# Patient Record
Sex: Male | Born: 1980 | Race: Black or African American | Hispanic: No | Marital: Single | Smoking: Never smoker
Health system: Southern US, Community
[De-identification: ages and names within clinical notes are randomized; demographics above are authoritative.]

## PROBLEM LIST (undated history)

## (undated) DIAGNOSIS — Z973 Presence of spectacles and contact lenses: Secondary | ICD-10-CM

## (undated) DIAGNOSIS — S83206A Unspecified tear of unspecified meniscus, current injury, right knee, initial encounter: Secondary | ICD-10-CM

## (undated) DIAGNOSIS — M1711 Unilateral primary osteoarthritis, right knee: Secondary | ICD-10-CM

## (undated) DIAGNOSIS — M1990 Unspecified osteoarthritis, unspecified site: Secondary | ICD-10-CM

## (undated) DIAGNOSIS — E7800 Pure hypercholesterolemia, unspecified: Secondary | ICD-10-CM

## (undated) DIAGNOSIS — F1010 Alcohol abuse, uncomplicated: Secondary | ICD-10-CM

## (undated) HISTORY — DX: Alcohol abuse, uncomplicated: F10.10

## (undated) HISTORY — DX: Pure hypercholesterolemia, unspecified: E78.00

## (undated) HISTORY — DX: Unspecified osteoarthritis, unspecified site: M19.90

## (undated) HISTORY — DX: Unilateral primary osteoarthritis, right knee: M17.11

---

## 2007-08-10 HISTORY — PX: KNEE ARTHROSCOPY WITH MENISCAL REPAIR: SHX5653

## 2012-01-13 ENCOUNTER — Encounter (HOSPITAL_COMMUNITY): Payer: Self-pay | Admitting: Emergency Medicine

## 2012-01-13 ENCOUNTER — Emergency Department (HOSPITAL_COMMUNITY)
Admission: EM | Admit: 2012-01-13 | Discharge: 2012-01-13 | Disposition: A | Discharge status: Home / Self Care | Payer: 59 | Source: Home / Self Care | Attending: Family Medicine | Admitting: Family Medicine

## 2012-01-13 DIAGNOSIS — R300 Dysuria: Secondary | ICD-10-CM

## 2012-01-13 LAB — POCT URINALYSIS DIP (DEVICE)
Bilirubin Urine: NEGATIVE
Glucose, UA: NEGATIVE mg/dL
Hgb urine dipstick: NEGATIVE
Ketones, ur: NEGATIVE mg/dL
Nitrite: NEGATIVE
Specific Gravity, Urine: <=1.005 (ref 1.005–1.030)
pH: 6 (ref 5.0–8.0)

## 2012-01-13 MED ORDER — PHENAZOPYRIDINE HCL 200 MG PO TABS: 200.0000 mg | ORAL_TABLET | Freq: Three times a day (TID) | ORAL | Status: AC

## 2012-01-13 NOTE — ED Notes (Signed)
No pcp

## 2012-01-13 NOTE — ED Notes (Signed)
4-5 days ago noticed burning before and after urination.  Deer antlier spray was a supplement patient had started the same day symptoms started.  Used supplement one day, now continue to have burning before urinating .  Denies abdominal or back pain.  Denies penile discharge

## 2012-01-13 NOTE — ED Provider Notes (Signed)
History     CSN: [id number]  Arrival date & time 01/13/12  1137   First MD Initiated Contact with Patient 01/13/12 1249      Chief Complaint  Patient presents with  . Dysuria    (Consider location/radiation/quality/duration/timing/severity/associated sxs/prior treatment) HPI Comments: The patient reports having burning on urination. Onset 4-5 days ago after starting a new supplement (deer antler spray). States initially he had burning before,during,and after urination. States he has stopped the spray and now burns only prior to urination. Denies any other symptoms such as back pain, fever, hematuria, n/v/d/c/   The history is provided by the patient.    History reviewed. No pertinent past medical history.  Past Surgical History  Procedure Date  . Knee surgery     meniscus tear    History reviewed. No pertinent family history.  History  Substance Use Topics  . Smoking status: Never Smoker   . Smokeless tobacco: Not on file  . Alcohol Use: Yes      Review of Systems  Constitutional: Negative.   HENT: Negative.   Respiratory: Negative.   Cardiovascular: Negative.   Genitourinary: Positive for dysuria. Negative for frequency, hematuria, flank pain and discharge.  Musculoskeletal: Negative.     Allergies  Review of patient's allergies indicates no known allergies.  Home Medications   Current Outpatient Rx  Name Route Sig Dispense Refill  . OVER THE COUNTER MEDICATION  "jack 3 d"    . PHENAZOPYRIDINE HCL 200 MG PO TABS Oral Take 1 tablet (200 mg total) by mouth 3 (three) times daily. 6 tablet 0    BP 137/86  Pulse 62  Temp(Src) 98.3 F (36.8 C) (Oral)  Resp 18  SpO2 100%  Physical Exam  Nursing note and vitals reviewed. Constitutional: He appears well-developed and well-nourished.  Cardiovascular: Normal rate and regular rhythm.   Pulmonary/Chest: Effort normal and breath sounds normal.  Abdominal: Soft. Bowel sounds are normal. There is no tenderness.    Genitourinary: Penis normal.  Musculoskeletal: Normal range of motion.    ED Course  Procedures (including critical care time)   Labs Reviewed  POCT URINALYSIS DIP (DEVICE)   No results found.   1. Dysuria       MDM          Randa Spike, MD 01/13/12 [phone]

## 2012-01-13 NOTE — Discharge Instructions (Signed)
Stop the  Antler extract. Follow up if symptoms persist.

## 2012-08-14 ENCOUNTER — Encounter (HOSPITAL_COMMUNITY): Payer: Self-pay | Admitting: *Deleted

## 2012-08-14 ENCOUNTER — Emergency Department (HOSPITAL_COMMUNITY)
Admission: EM | Admit: 2012-08-14 | Discharge: 2012-08-14 | Disposition: A | Discharge status: Home / Self Care | Payer: 59 | Source: Home / Self Care

## 2012-08-14 ENCOUNTER — Other Ambulatory Visit (HOSPITAL_COMMUNITY)
Admission: RE | Admit: 2012-08-14 | Discharge: 2012-08-14 | Disposition: A | Discharge status: Home / Self Care | Payer: 59 | Source: Ambulatory Visit | Attending: Emergency Medicine | Admitting: Emergency Medicine

## 2012-08-14 DIAGNOSIS — R300 Dysuria: Secondary | ICD-10-CM

## 2012-08-14 DIAGNOSIS — N342 Other urethritis: Secondary | ICD-10-CM

## 2012-08-14 DIAGNOSIS — N508 Other specified disorders of male genital organs: Secondary | ICD-10-CM

## 2012-08-14 DIAGNOSIS — Z113 Encounter for screening for infections with a predominantly sexual mode of transmission: Secondary | ICD-10-CM | POA: Insufficient documentation

## 2012-08-14 DIAGNOSIS — N5312 Painful ejaculation: Secondary | ICD-10-CM

## 2012-08-14 LAB — POCT URINALYSIS DIP (DEVICE)
Bilirubin Urine: NEGATIVE
Hgb urine dipstick: NEGATIVE
Ketones, ur: NEGATIVE mg/dL
Protein, ur: NEGATIVE mg/dL
Specific Gravity, Urine: 1.015 (ref 1.005–1.030)
pH: 7.5 (ref 5.0–8.0)

## 2012-08-14 MED ORDER — DOXYCYCLINE HYCLATE 100 MG PO CAPS: 100.0000 mg | ORAL_CAPSULE | Freq: Two times a day (BID) | ORAL | Status: DC

## 2012-08-14 NOTE — ED Provider Notes (Signed)
History     CSN: [id number]  Arrival date & time 08/14/12  1019   First MD Initiated Contact with Patient 08/14/12 1147      Chief Complaint  Patient presents with  . Urinary Tract Infection    (Consider location/radiation/quality/duration/timing/severity/associated sxs/prior treatment) HPI Comments: 32 year old male presents with urethral burning after urination and ejaculation. He said the symptoms off and on 4-6 months. Initially attributed this to various workout drinks that he had been taking. He had a visit here in June of 2013 for similar complaints. He was complaining at that time was dysuria and he had been using a deer antler spray. Denies polyuria, hematuria or penile drainage.   History reviewed. No pertinent past medical history.  Past Surgical History  Procedure Date  . Knee surgery     meniscus tear    Family History  Problem Relation Age of Onset  . Family history unknown: Yes    History  Substance Use Topics  . Smoking status: Never Smoker   . Smokeless tobacco: Not on file  . Alcohol Use: Yes      Review of Systems  Cardiovascular: Negative.   Genitourinary: Positive for dysuria. Negative for urgency, frequency, hematuria, flank pain, decreased urine volume, discharge, penile swelling, scrotal swelling, difficulty urinating, penile pain and testicular pain.  All other systems reviewed and are negative.    Allergies  Review of patient's allergies indicates no known allergies.  Home Medications   Current Outpatient Rx  Name  Route  Sig  Dispense  Refill  . DOXYCYCLINE HYCLATE 100 MG PO CAPS   Oral   Take 1 capsule (100 mg total) by mouth 2 (two) times daily.   20 capsule   0   . OVER THE COUNTER MEDICATION      "jack 3 d"           BP 161/99  Pulse 110  Temp 98.1 F (36.7 C)  Resp 20  SpO2 98%  Physical Exam  Nursing note and vitals reviewed. Constitutional: He is oriented to person, place, and time. He appears  well-developed and well-nourished. No distress.  Neck: Normal range of motion. Neck supple.  Pulmonary/Chest: Effort normal.  Abdominal: Soft. There is no tenderness.  Genitourinary: Penis normal. No penile tenderness.       Normal phallus. No urethral discharge observer expressed. No penile, scrotal or testicular tenderness. No enlargement or tenderness in the epididymal apparatus. No surrounding lymphadenopathy.  Neurological: He is alert and oriented to person, place, and time. He exhibits normal muscle tone.  Skin: Skin is warm and dry. No erythema.  Psychiatric: He has a normal mood and affect.    ED Course  Procedures (including critical care time)   Labs Reviewed  POCT URINALYSIS DIP (DEVICE)  URINE CYTOLOGY ANCILLARY ONLY   No results found.   1. Dysuria   2. Painful ejaculation   3. Urethritis       MDM  The specific etiology of his pain is uncertain at this point. I did obtain a urinalysis for GC, Chlamydia and Trichomonas. Urinalysis is clear. Presumptive diagnosis of urethritis will be treated with doxycycline 100 mg twice a day for 10 days. He pain a PCP soon as he can as he does have insurance with [employer]. At that time he will most likely need a referral to urology. Results for orders placed during the hospital encounter of 08/14/12  POCT URINALYSIS DIP (DEVICE)      Component Value Range  Glucose, UA NEGATIVE  NEGATIVE mg/dL   Bilirubin Urine NEGATIVE  NEGATIVE   Ketones, ur NEGATIVE  NEGATIVE mg/dL   Specific Gravity, Urine 1.015  1.005 - 1.030   Hgb urine dipstick NEGATIVE  NEGATIVE   pH 7.5  5.0 - 8.0   Protein, ur NEGATIVE  NEGATIVE mg/dL   Urobilinogen, UA 0.2  0.0 - 1.0 mg/dL   Nitrite NEGATIVE  NEGATIVE   Leukocytes, UA NEGATIVE  NEGATIVE           Hayden Rasmussen, NP 08/14/12 1206

## 2012-08-14 NOTE — ED Notes (Signed)
Pt reports recent pain/burning with ejaculation - denies discharge. States that this has happened before after taking workout supplements. Recently started drinking preworkout drink and burning returned.

## 2012-08-14 NOTE — ED Provider Notes (Signed)
Medical screening examination/treatment/procedure(s) were performed by non-physician practitioner and as supervising physician I was immediately available for consultation/collaboration.  Amore Ackman, M.D.   Angy Swearengin C Aminta Sakurai, MD 08/14/12 2238 

## 2013-01-15 ENCOUNTER — Ambulatory Visit (INDEPENDENT_AMBULATORY_CARE_PROVIDER_SITE_OTHER): Payer: 59 | Admitting: Family Medicine

## 2013-01-15 VITALS — BP 112/76 | HR 60 | Temp 97.7°F | Resp 18 | Ht 73.0 in | Wt 215.0 lb

## 2013-01-15 DIAGNOSIS — B354 Tinea corporis: Secondary | ICD-10-CM

## 2013-01-15 DIAGNOSIS — Z0000 Encounter for general adult medical examination without abnormal findings: Secondary | ICD-10-CM

## 2013-01-15 DIAGNOSIS — B360 Pityriasis versicolor: Secondary | ICD-10-CM

## 2013-01-15 LAB — POCT CBC
Lymph, poc: 2.4 (ref 0.6–3.4)
MCH, POC: 29.2 pg (ref 27–31.2)
MCHC: 31 g/dL — AB (ref 31.8–35.4)
MCV: 94.4 fL (ref 80–97)
MID (cbc): 0.4 (ref 0–0.9)
MPV: 9.7 fL (ref 0–99.8)
POC MID %: 8.5 %M (ref 0–12)
Platelet Count, POC: 283 10*3/uL (ref 142–424)
RBC: 4.93 M/uL (ref 4.69–6.13)
WBC: 5 10*3/uL (ref 4.6–10.2)

## 2013-01-15 MED ORDER — KETOCONAZOLE 200 MG PO TABS: 200.0000 mg | ORAL_TABLET | Freq: Every day | ORAL | Status: DC

## 2013-01-15 NOTE — Progress Notes (Signed)
Examination:  History: No major acute medical complaints. He needs a form completed regarding his health.  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 No major familial diseases are reported.  Social history: Does not smoke. Has 1-2 drinks of alcohol per week. Does not use any illicit drugs. 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 He is interested in some scarring a hospital back in his hometown area in [place] which is knee. He is married to [employer]. Uses condoms for conception. Has had a college education. Exercises 1-2 times a week.  Review of systems: Constitutional: Some fatigue in getting up and going, so he takes a supplement that has caffeine in it. HEENT: Unremarkable Respiratory: Unremarkable Cardiovascular: Unremarkable Gastrointestinal: Unremarkable Genitourinary: Unremarkable The skeletal: Unremarkable Neurologic: Unremarkable Dermatologic: Rash on trunk and right arm Neurologic: Unremarkable Psychiatric: Unremarkable   Physical examination Well-developed well-nourished man in no major acute distress. TMs are normal. Eyes PERRLA. Fundi benign. Throat clear. Neck supple without nodes. Chest clear. Heart regular without murmurs. And soft without mass tenderness. Normal male external genitalia. No hernias. Spine normal. Extremities unremarkable. Skin has a flaky rash on his trunk which is blotchy, consistent with tinea versicolor. He has a couple of a fungal-appearing rash fossa his right hand which probably is a different fungus.  Assessment: Normal physical examination Tinea versicolor Tinea corporis History of left knee meniscus repair  Plan: Check labs. If everything is good advised him to take a course of ketoconazole  see if we can get that trouble rash cleared up completely. He has had a single dose treatment in the past which did not work, so we'll give about a ten-day course. Use some Lamisil OTC cream on the rash on his hand.

## 2013-01-15 NOTE — Patient Instructions (Addendum)
If your laboratory comes back good, then take the ketoconazole one daily for 10 day  Return if problem

## 2013-01-16 LAB — LIPID PANEL
HDL: 47 mg/dL (ref >39)
LDL Cholesterol: 82 mg/dL (ref 0–99)
Total CHOL/HDL Ratio: 3.1 Ratio

## 2013-01-16 LAB — COMPREHENSIVE METABOLIC PANEL
ALT: 15 U/L (ref 0–53)
BUN: 13 mg/dL (ref 6–23)
CO2: 28 mEq/L (ref 19–32)
Calcium: 9.4 mg/dL (ref 8.4–10.5)
Chloride: 103 mEq/L (ref 96–112)
Creat: 1.04 mg/dL (ref 0.50–1.35)
Glucose, Bld: 80 mg/dL (ref 70–99)
Potassium: 4.6 mEq/L (ref 3.5–5.3)
Total Bilirubin: 0.6 mg/dL (ref 0.3–1.2)

## 2013-02-26 ENCOUNTER — Ambulatory Visit: Payer: Self-pay

## 2013-02-26 ENCOUNTER — Other Ambulatory Visit: Payer: Self-pay | Admitting: Occupational Medicine

## 2013-02-26 DIAGNOSIS — R52 Pain, unspecified: Secondary | ICD-10-CM

## 2013-02-28 ENCOUNTER — Other Ambulatory Visit (HOSPITAL_COMMUNITY): Payer: Self-pay | Admitting: Family Medicine

## 2013-02-28 DIAGNOSIS — T148XXA Other injury of unspecified body region, initial encounter: Secondary | ICD-10-CM

## 2013-03-02 ENCOUNTER — Ambulatory Visit (HOSPITAL_COMMUNITY): Payer: 59

## 2013-03-05 ENCOUNTER — Ambulatory Visit (HOSPITAL_COMMUNITY)
Admission: RE | Admit: 2013-03-05 | Discharge: 2013-03-05 | Disposition: A | Discharge status: Home / Self Care | Payer: PRIVATE HEALTH INSURANCE | Source: Ambulatory Visit | Attending: Family Medicine | Admitting: Family Medicine

## 2013-03-05 DIAGNOSIS — IMO0002 Reserved for concepts with insufficient information to code with codable children: Secondary | ICD-10-CM | POA: Insufficient documentation

## 2013-03-05 DIAGNOSIS — M6740 Ganglion, unspecified site: Secondary | ICD-10-CM | POA: Insufficient documentation

## 2013-03-05 DIAGNOSIS — M25469 Effusion, unspecified knee: Secondary | ICD-10-CM | POA: Insufficient documentation

## 2013-03-05 DIAGNOSIS — M6750 Plica syndrome, unspecified knee: Secondary | ICD-10-CM | POA: Insufficient documentation

## 2013-03-05 DIAGNOSIS — M2390 Unspecified internal derangement of unspecified knee: Secondary | ICD-10-CM | POA: Insufficient documentation

## 2013-03-05 DIAGNOSIS — T148XXA Other injury of unspecified body region, initial encounter: Secondary | ICD-10-CM

## 2013-03-05 DIAGNOSIS — X58XXXA Exposure to other specified factors, initial encounter: Secondary | ICD-10-CM | POA: Insufficient documentation

## 2013-05-29 ENCOUNTER — Encounter (HOSPITAL_BASED_OUTPATIENT_CLINIC_OR_DEPARTMENT_OTHER): Payer: Self-pay | Admitting: *Deleted

## 2013-05-30 ENCOUNTER — Encounter (HOSPITAL_BASED_OUTPATIENT_CLINIC_OR_DEPARTMENT_OTHER): Payer: Self-pay | Admitting: *Deleted

## 2013-05-30 NOTE — Progress Notes (Addendum)
NPO AFTER MN WITH EXCEPTION CLEAR LIQUIDS UNTIL 0900 (NO CREAM/ MILK PRODUCTS). ARRIVE 1300. NEEDS HG. 

## 2013-05-30 NOTE — Progress Notes (Signed)
05/30/13 1224  OBSTRUCTIVE SLEEP APNEA  Have you ever been diagnosed with sleep apnea through a sleep study? No  Do you snore loudly (loud enough to be heard through closed doors)?  1  Do you often feel tired, fatigued, or sleepy during the daytime? 0  Has anyone observed you stop breathing during your sleep? 1  Do you have, or are you being treated for high blood pressure? 0  BMI more than 35 kg/m2? 0  Age over 32 years old? 0  Neck circumference greater than 40 cm/18 inches? 1  Gender: 1  Obstructive Sleep Apnea Score 4  Score 4 or greater  Results sent to PCP   

## 2013-06-04 ENCOUNTER — Other Ambulatory Visit: Payer: Self-pay | Admitting: Orthopedic Surgery

## 2013-06-05 ENCOUNTER — Ambulatory Visit (HOSPITAL_BASED_OUTPATIENT_CLINIC_OR_DEPARTMENT_OTHER): Payer: PRIVATE HEALTH INSURANCE | Admitting: Anesthesiology

## 2013-06-05 ENCOUNTER — Encounter (HOSPITAL_BASED_OUTPATIENT_CLINIC_OR_DEPARTMENT_OTHER)
Admission: RE | Disposition: A | Discharge status: Home / Self Care | Payer: Self-pay | Source: Ambulatory Visit | Attending: Specialist

## 2013-06-05 ENCOUNTER — Ambulatory Visit (HOSPITAL_BASED_OUTPATIENT_CLINIC_OR_DEPARTMENT_OTHER)
Admission: RE | Admit: 2013-06-05 | Discharge: 2013-06-05 | Disposition: A | Discharge status: Home / Self Care | Payer: PRIVATE HEALTH INSURANCE | Source: Ambulatory Visit | Attending: Specialist | Admitting: Specialist

## 2013-06-05 ENCOUNTER — Encounter (HOSPITAL_BASED_OUTPATIENT_CLINIC_OR_DEPARTMENT_OTHER): Payer: Self-pay | Admitting: *Deleted

## 2013-06-05 ENCOUNTER — Encounter (HOSPITAL_BASED_OUTPATIENT_CLINIC_OR_DEPARTMENT_OTHER): Payer: PRIVATE HEALTH INSURANCE | Admitting: Anesthesiology

## 2013-06-05 DIAGNOSIS — Z9889 Other specified postprocedural states: Secondary | ICD-10-CM

## 2013-06-05 DIAGNOSIS — M2240 Chondromalacia patellae, unspecified knee: Secondary | ICD-10-CM | POA: Insufficient documentation

## 2013-06-05 DIAGNOSIS — M1710 Unilateral primary osteoarthritis, unspecified knee: Secondary | ICD-10-CM | POA: Insufficient documentation

## 2013-06-05 DIAGNOSIS — IMO0002 Reserved for concepts with insufficient information to code with codable children: Secondary | ICD-10-CM | POA: Insufficient documentation

## 2013-06-05 DIAGNOSIS — X58XXXA Exposure to other specified factors, initial encounter: Secondary | ICD-10-CM | POA: Insufficient documentation

## 2013-06-05 HISTORY — PX: KNEE ARTHROSCOPY WITH MEDIAL MENISECTOMY: SHX5651

## 2013-06-05 HISTORY — DX: Unspecified tear of unspecified meniscus, current injury, right knee, initial encounter: S83.206A

## 2013-06-05 HISTORY — DX: Presence of spectacles and contact lenses: Z97.3

## 2013-06-05 LAB — POCT HEMOGLOBIN-HEMACUE: Hemoglobin: 14.5 g/dL (ref 13.0–17.0)

## 2013-06-05 SURGERY — ARTHROSCOPY, KNEE, WITH MEDIAL MENISCECTOMY
Anesthesia: General | Site: Knee | Laterality: Right | Wound class: Clean

## 2013-06-05 MED ORDER — CEPHALEXIN 500 MG PO CAPS: 500.0000 mg | ORAL_CAPSULE | Freq: Three times a day (TID) | ORAL | Status: DC

## 2013-06-05 MED ORDER — CEFAZOLIN SODIUM-DEXTROSE 2-3 GM-% IV SOLR
2.0000 g | INTRAVENOUS | Status: AC
  Administered 2013-06-05: 2 g via INTRAVENOUS
  Filled 2013-06-05: qty 50

## 2013-06-05 MED ORDER — LACTATED RINGERS IV SOLN
INTRAVENOUS | Status: DC
  Filled 2013-06-05: qty 1000

## 2013-06-05 MED ORDER — SODIUM CHLORIDE 0.9 % IR SOLN
Status: DC | PRN
  Administered 2013-06-05: 9000 mL

## 2013-06-05 MED ORDER — ONDANSETRON HCL 4 MG/2ML IJ SOLN
INTRAMUSCULAR | Status: DC | PRN
  Administered 2013-06-05: 4 mg via INTRAVENOUS

## 2013-06-05 MED ORDER — LIDOCAINE HCL (CARDIAC) 20 MG/ML IV SOLN
INTRAVENOUS | Status: DC | PRN
  Administered 2013-06-05: 80 mg via INTRAVENOUS

## 2013-06-05 MED ORDER — FENTANYL CITRATE 0.05 MG/ML IJ SOLN
INTRAMUSCULAR | Status: DC | PRN
  Administered 2013-06-05: 50 ug via INTRAVENOUS
  Administered 2013-06-05 (×4): 25 ug via INTRAVENOUS
  Administered 2013-06-05: 50 ug via INTRAVENOUS

## 2013-06-05 MED ORDER — PROPOFOL 10 MG/ML IV BOLUS
INTRAVENOUS | Status: DC | PRN
  Administered 2013-06-05: 200 mg via INTRAVENOUS

## 2013-06-05 MED ORDER — GLYCOPYRROLATE 0.2 MG/ML IJ SOLN
INTRAMUSCULAR | Status: DC | PRN
  Administered 2013-06-05: 0.2 mg via INTRAVENOUS

## 2013-06-05 MED ORDER — HYDROCODONE-ACETAMINOPHEN 7.5-325 MG PO TABS: 1.0000 | ORAL_TABLET | Freq: Four times a day (QID) | ORAL | Status: DC | PRN

## 2013-06-05 MED ORDER — BUPIVACAINE HCL 0.25 % IJ SOLN
INTRAMUSCULAR | Status: DC | PRN
  Administered 2013-06-05: 30 mL

## 2013-06-05 MED ORDER — HYDROMORPHONE HCL PF 1 MG/ML IJ SOLN
0.2500 mg | INTRAMUSCULAR | Status: DC | PRN
  Administered 2013-06-05: 0.5 mg via INTRAVENOUS
  Filled 2013-06-05: qty 1

## 2013-06-05 MED ORDER — LACTATED RINGERS IV SOLN
INTRAVENOUS | Status: DC
  Administered 2013-06-05 (×2): via INTRAVENOUS
  Filled 2013-06-05: qty 1000

## 2013-06-05 MED ORDER — MIDAZOLAM HCL 5 MG/5ML IJ SOLN
INTRAMUSCULAR | Status: DC | PRN
  Administered 2013-06-05: 2 mg via INTRAVENOUS

## 2013-06-05 MED ORDER — MORPHINE SULFATE 4 MG/ML IJ SOLN
INTRAMUSCULAR | Status: DC | PRN
  Administered 2013-06-05: 4 mg via INTRAVENOUS

## 2013-06-05 MED ORDER — HYDROCODONE-ACETAMINOPHEN 7.5-325 MG PO TABS
1.0000 | ORAL_TABLET | Freq: Four times a day (QID) | ORAL | Status: DC | PRN
  Administered 2013-06-05: 1 via ORAL
  Filled 2013-06-05: qty 1

## 2013-06-05 MED ORDER — KETOROLAC TROMETHAMINE 30 MG/ML IJ SOLN
INTRAMUSCULAR | Status: DC | PRN
  Administered 2013-06-05: 30 mg via INTRAVENOUS

## 2013-06-05 MED ORDER — DEXAMETHASONE SODIUM PHOSPHATE 4 MG/ML IJ SOLN
INTRAMUSCULAR | Status: DC | PRN
  Administered 2013-06-05: 10 mg via INTRAVENOUS

## 2013-06-05 MED ORDER — POVIDONE-IODINE 7.5 % EX SOLN
Freq: Once | CUTANEOUS | Status: DC
  Filled 2013-06-05: qty 118

## 2013-06-05 MED ORDER — SODIUM CHLORIDE 0.9 % IV SOLN
INTRAVENOUS | Status: DC
  Filled 2013-06-05: qty 1000

## 2013-06-05 MED ORDER — PROMETHAZINE HCL 25 MG/ML IJ SOLN
6.2500 mg | INTRAMUSCULAR | Status: DC | PRN
  Filled 2013-06-05: qty 1

## 2013-06-05 MED ORDER — ASPIRIN EC 325 MG PO TBEC: 325.0000 mg | DELAYED_RELEASE_TABLET | Freq: Two times a day (BID) | ORAL | Status: DC

## 2013-06-05 SURGICAL SUPPLY — 46 items
BANDAGE ELASTIC 6 VELCRO ST LF (GAUZE/BANDAGES/DRESSINGS) ×2 IMPLANT
BANDAGE ESMARK 6X9 LF (GAUZE/BANDAGES/DRESSINGS) ×1 IMPLANT
BANDAGE GAUZE ELAST BULKY 4 IN (GAUZE/BANDAGES/DRESSINGS) ×4 IMPLANT
BLADE 4.2CUDA (BLADE) IMPLANT
BLADE CUDA GRT WHITE 3.5 (BLADE) ×2 IMPLANT
BLADE CUDA SHAVER 3.5 (BLADE) IMPLANT
BNDG ESMARK 6X9 LF (GAUZE/BANDAGES/DRESSINGS) ×2
CANISTER SUCT LVC 12 LTR MEDI- (MISCELLANEOUS) ×4 IMPLANT
CANISTER SUCTION 1200CC (MISCELLANEOUS) ×2 IMPLANT
CLOTH BEACON ORANGE TIMEOUT ST (SAFETY) ×2 IMPLANT
CUFF TOURNIQUET SINGLE 34IN LL (TOURNIQUET CUFF) ×2 IMPLANT
DRAPE ARTHROSCOPY W/POUCH 114 (DRAPES) ×2 IMPLANT
DRAPE INCISE 23X17 IOBAN STRL (DRAPES) ×1
DRAPE INCISE IOBAN 23X17 STRL (DRAPES) ×1 IMPLANT
DRAPE INCISE IOBAN 66X45 STRL (DRAPES) ×2 IMPLANT
DRSG PAD ABDOMINAL 8X10 ST (GAUZE/BANDAGES/DRESSINGS) ×2 IMPLANT
DURAPREP 26ML APPLICATOR (WOUND CARE) ×2 IMPLANT
ELECT MENISCUS 165MM 90D (ELECTRODE) IMPLANT
ELECT REM PT RETURN 9FT ADLT (ELECTROSURGICAL)
ELECTRODE REM PT RTRN 9FT ADLT (ELECTROSURGICAL) IMPLANT
GAUZE XEROFORM 1X8 LF (GAUZE/BANDAGES/DRESSINGS) ×2 IMPLANT
GLOVE INDICATOR 8.0 STRL GRN (GLOVE) ×4 IMPLANT
GLOVE SURG ORTHO 8.0 STRL STRW (GLOVE) ×4 IMPLANT
GOWN PREVENTION PLUS LG XLONG (DISPOSABLE) ×2 IMPLANT
GOWN STRL REIN XL XLG (GOWN DISPOSABLE) ×2 IMPLANT
IMMOBILIZER KNEE 22 UNIV (SOFTGOODS) IMPLANT
IMMOBILIZER KNEE 24 THIGH 36 (MISCELLANEOUS) IMPLANT
IMMOBILIZER KNEE 24 UNIV (MISCELLANEOUS)
IV NS IRRIG 3000ML ARTHROMATIC (IV SOLUTION) ×6 IMPLANT
KNEE WRAP E Z 3 GEL PACK (MISCELLANEOUS) ×2 IMPLANT
MINI VAC (SURGICAL WAND) ×2 IMPLANT
NEEDLE HYPO 22GX1.5 SAFETY (NEEDLE) ×2 IMPLANT
PACK ARTHROSCOPY DSU (CUSTOM PROCEDURE TRAY) ×2 IMPLANT
PACK BASIN DAY SURGERY FS (CUSTOM PROCEDURE TRAY) ×2 IMPLANT
PADDING CAST ABS 4INX4YD NS (CAST SUPPLIES) ×1
PADDING CAST ABS COTTON 4X4 ST (CAST SUPPLIES) ×1 IMPLANT
PENCIL BUTTON HOLSTER BLD 10FT (ELECTRODE) IMPLANT
SET ARTHROSCOPY TUBING (MISCELLANEOUS) ×1
SET ARTHROSCOPY TUBING LN (MISCELLANEOUS) ×1 IMPLANT
SPONGE GAUZE 4X4 12PLY (GAUZE/BANDAGES/DRESSINGS) ×2 IMPLANT
SUT ETHILON 3 0 FSL (SUTURE) ×2 IMPLANT
SYR CONTROL 10ML LL (SYRINGE) ×2 IMPLANT
TOWEL OR 17X24 6PK STRL BLUE (TOWEL DISPOSABLE) ×2 IMPLANT
WAND 30 DEG SABER W/CORD (SURGICAL WAND) IMPLANT
WAND 90 DEG TURBOVAC W/CORD (SURGICAL WAND) IMPLANT
WATER STERILE IRR 500ML POUR (IV SOLUTION) ×2 IMPLANT

## 2013-06-05 NOTE — Anesthesia Preprocedure Evaluation (Signed)
Anesthesia Evaluation  Patient identified by MRN, date of birth, ID band Patient awake    Reviewed: Allergy & Precautions, H&P , NPO status , Patient's Chart, lab work & pertinent test results  Airway Mallampati: II TM Distance: >3 FB Neck ROM: Full    Dental  (+) Teeth Intact and Dental Advisory Given   Pulmonary neg pulmonary ROS,  breath sounds clear to auscultation  Pulmonary exam normal       Cardiovascular negative cardio ROS  Rhythm:Regular Rate:Normal     Neuro/Psych negative neurological ROS  negative psych ROS   GI/Hepatic negative GI ROS, Neg liver ROS,   Endo/Other  negative endocrine ROS  Renal/GU negative Renal ROS  negative genitourinary   Musculoskeletal negative musculoskeletal ROS (+)   Abdominal   Peds negative pediatric ROS (+)  Hematology negative hematology ROS (+)   Anesthesia Other Findings   Reproductive/Obstetrics                           Anesthesia Physical Anesthesia Plan  ASA: I  Anesthesia Plan: General   Post-op Pain Management:    Induction: Intravenous  Airway Management Planned: LMA  Additional Equipment:   Intra-op Plan:   Post-operative Plan: Extubation in OR  Informed Consent: I have reviewed the patients History and Physical, chart, labs and discussed the procedure including the risks, benefits and alternatives for the proposed anesthesia with the patient or authorized representative who has indicated his/her understanding and acceptance.   Dental advisory given  Plan Discussed with: CRNA  Anesthesia Plan Comments:         Anesthesia Quick Evaluation

## 2013-06-05 NOTE — Interval H&P Note (Signed)
History and Physical Interval Note:  06/05/2013 2:38 PM  Francis Herman  has presented today for surgery, with the diagnosis of RIGHT KNEE MEDIAL MENISCAL TEAR AND OA   The various methods of treatment have been discussed with the patient and family. After consideration of risks, benefits and other options for treatment, the patient has consented to  Procedure(s): RIGHT KNEE ARTHROSCOPY WITH MEDIAL MENISCECTOMY CHRONDROPLASTY AND POSSIBLE MICRO FRACTURE  (Right) as a surgical intervention .  The patient's history has been reviewed, patient examined, no change in status, stable for surgery.  I have reviewed the patient's chart and labs.  Questions were answered to the patient's satisfaction.     Francis Herman ANDREW   

## 2013-06-05 NOTE — H&P (View-Only) (Signed)
05/30/13 1224  OBSTRUCTIVE SLEEP APNEA  Have you ever been diagnosed with sleep apnea through a sleep study? No  Do you snore loudly (loud enough to be heard through closed doors)?  1  Do you often feel tired, fatigued, or sleepy during the daytime? 0  Has anyone observed you stop breathing during your sleep? 1  Do you have, or are you being treated for high blood pressure? 0  BMI more than 35 kg/m2? 0  Age over 32 years old? 0  Neck circumference greater than 40 cm/18 inches? 1  Gender: 1  Obstructive Sleep Apnea Score 4  Score 4 or greater  Results sent to PCP

## 2013-06-05 NOTE — Interval H&P Note (Signed)
History and Physical Interval Note:  06/05/2013 2:38 PM  [name]  has presented today for surgery, with the diagnosis of RIGHT KNEE MEDIAL MENISCAL TEAR AND OA   The various methods of treatment have been discussed with the patient and family. After consideration of risks, benefits and other options for treatment, the patient has consented to  Procedure(s): RIGHT KNEE ARTHROSCOPY WITH MEDIAL MENISCECTOMY CHRONDROPLASTY AND POSSIBLE MICRO FRACTURE  (Right) as a surgical intervention .  The patient's history has been reviewed, patient examined, no change in status, stable for surgery.  I have reviewed the patient's chart and labs.  Questions were answered to the patient's satisfaction.     Francis Herman ANDREW   

## 2013-06-05 NOTE — H&P (View-Only) (Signed)
NPO AFTER MN WITH EXCEPTION CLEAR LIQUIDS UNTIL 0900 (NO CREAM/ MILK PRODUCTS). ARRIVE 1300. NEEDS HG.

## 2013-06-05 NOTE — Transfer of Care (Signed)
Immediate Anesthesia Transfer of Care Note  Patient: Francis Herman  Procedure(s) Performed: Procedure(s) (LRB):  Right Knee Arthroscopy with Medial Meniscectomy Chondroplasty and Micro Fracture (Right)  Patient Location: PACU  Anesthesia Type: General  Level of Consciousness: awake, sedated, patient cooperative and responds to stimulation  Airway & Oxygen Therapy: Patient Spontanous Breathing and Patient connected to face mask oxygen  Post-op Assessment: Report given to PACU RN, Post -op Vital signs reviewed and stable and Patient moving all extremities  Post vital signs: Reviewed and stable  Complications: No apparent anesthesia complications

## 2013-06-05 NOTE — H&P (Signed)
Francis Herman is an 32 y.o. male.   Chief Complaint: Right knee pain HPI: Patient presents with right knee pain.  Despite conservative treatments , he continues to have symptoms. Imaging showed Meniscus tear. Surgical intervention was discussed including risks and benefits. Patient wishes to continue with surgery. Denies SOB, CP,or fever/chills.  Past Medical History  Diagnosis Date  . Acute meniscal tear of right knee   . Wears glasses     Past Surgical History  Procedure Laterality Date  . Knee arthroscopy with meniscal repair Left 2009    Family History  Problem Relation Age of Onset  . Arthritis Maternal Grandmother   . Hypertension Maternal Grandfather    Social History:  reports that he has never smoked. He has never used smokeless tobacco. He reports that he drinks about 1.5 ounces of alcohol per week. He reports that he does not use illicit drugs.  Allergies: No Known Allergies  Medications Prior to Admission  Medication Sig Dispense Refill  . celecoxib (CELEBREX) 200 MG capsule Take 200 mg by mouth as needed for pain.        Results for orders placed during the hospital encounter of 06/05/13 (from the past 48 hour(s))  POCT HEMOGLOBIN-HEMACUE     Status: None   Collection Time    06/05/13  2:19 PM      Result Value Range   Hemoglobin 14.5  13.0 - 17.0 g/dL   No results found.  Review of Systems  Constitutional: Negative.   HENT: Negative.   Eyes: Negative.   Respiratory: Negative.   Cardiovascular: Negative.   Gastrointestinal: Negative.   Genitourinary: Negative.   Musculoskeletal: Positive for joint pain.  Skin: Negative.   Neurological: Negative.   Endo/Heme/Allergies: Negative.   Psychiatric/Behavioral: Negative.     Blood pressure 131/86, pulse 71, temperature 98.1 F (36.7 C), temperature source Oral, resp. rate 18, height 6\' 2"  (1.88 m), weight 95.709 kg (211 lb), SpO2 96.00%. Physical Exam  Constitutional: He appears well-developed.  HENT:   Head: Normocephalic.  Eyes: EOM are normal.  Neck: Normal range of motion.  Cardiovascular: Normal rate.   Respiratory: Effort normal.  GI: Soft.  Genitourinary:  Deferred  Musculoskeletal: He exhibits edema and tenderness.  Right knee     Assessment/Plan Right knee TMM and OA: [facility] for arthroscopy with PMM and Chondroplasty. D/C home today Follow up in office in 7-10 days  Janis Sol L 06/05/2013, 2:31 PM

## 2013-06-05 NOTE — Interval H&P Note (Signed)
History and Physical Interval Note:  06/05/2013 2:38 PM  Francis Herman  has presented today for surgery, with the diagnosis of RIGHT KNEE MEDIAL MENISCAL TEAR AND OA   The various methods of treatment have been discussed with the patient and family. After consideration of risks, benefits and other options for treatment, the patient has consented to  Procedure(s): RIGHT KNEE ARTHROSCOPY WITH MEDIAL MENISCECTOMY CHRONDROPLASTY AND POSSIBLE MICRO FRACTURE  (Right) as a surgical intervention .  The patient's history has been reviewed, patient examined, no change in status, stable for surgery.  I have reviewed the patient's chart and labs.  Questions were answered to the patient's satisfaction.     Apolinar Bero ANDREW

## 2013-06-05 NOTE — Op Note (Signed)
Op note dictated [id number]

## 2013-06-05 NOTE — Anesthesia Procedure Notes (Signed)
Procedure Name: LMA Insertion Date/Time: 06/05/2013 2:47 PM Performed by: Renella Cunas D Pre-anesthesia Checklist: Patient identified, Emergency Drugs available, Suction available and Patient being monitored Patient Re-evaluated:Patient Re-evaluated prior to inductionOxygen Delivery Method: Circle System Utilized Preoxygenation: Pre-oxygenation with 100% oxygen Intubation Type: IV induction Ventilation: Mask ventilation without difficulty LMA: LMA inserted LMA Size: 5.0 Number of attempts: 1 Airway Equipment and Method: bite block Placement Confirmation: positive ETCO2 Tube secured with: Tape Dental Injury: Teeth and Oropharynx as per pre-operative assessment

## 2013-06-06 ENCOUNTER — Encounter (HOSPITAL_BASED_OUTPATIENT_CLINIC_OR_DEPARTMENT_OTHER): Payer: Self-pay | Admitting: Specialist

## 2013-06-06 NOTE — Op Note (Signed)
NAMESAQIB, CAZAREZ NO.:  [id number]  MEDICAL RECORD NO.:  [id number]  LOCATION:                                 FACILITY:  PHYSICIAN:  Erasmo Leventhal, M.D.DATE OF BIRTH:  08/31/1980  DATE OF PROCEDURE: DATE OF DISCHARGE:                              OPERATIVE REPORT   PREOPERATIVE DIAGNOSES: 1. Right knee torn medial meniscus. 2. Osteoarthritis.  POSTOPERATIVE DIAGNOSES: 1. Right knee large complex tear of medial meniscus. 2. Osteoarthritis of grade 4, medial femoral condyle, grade 4 lateral     femoral condyle, grade 3 and 4, patellofemoral.  PROCEDURES: 1. Right knee arthroscopic partial medial meniscectomy. 2. Chondroplasty with microfracture technique to the medial femoral     condyle. 3. Chondroplasty with microfracture technique to the lateral femoral     condyle. 4. Chondroplasty of the femoral trochlea and patella.  SURGEON:  Erasmo Leventhal, M.D.  ASSISTANT:  Arsenio Loader, PA-C  ANESTHESIA:  General with intraoperative knee block.  ESTIMATED BLOOD LOSS:  Less than 10 mL.  DRAINS:  None.  COMPLICATIONS:  None.  TOURNIQUET TIME:  None.  DISPOSITION:  PACU, stable.  OPERATIVE DETAILS:  The patient and family were counseled in the holding area.  The correct site was identified, marked and signed appropriately. IV was started on the way to the operating room and IV Ancef was given. In the OR, placed in supine position under LMA anesthesia.  Thigh high was prepped with DuraPrep, draped in sterile fashion.  Time-out was done to confirm all involved in room.  Arthroscopic portals were established, proximal medial, inferomedial, and inferolateral.  Diagnostic arthroscopy revealed grade 4 chondromalacia of femoral trochlea and the areas of grade 3, and grade 3 and 4 patella, confirmed by mechanics at a stable base.  ACL and PCL were intact.  Lateral side was inspected. Lateral meniscus was intact.  There was a quarter size  area of grade 4 chondromalacia of lateral femoral condyle.  He underwent a typical step in microfracture technique with debridement of the edges, nice edges, removed the calcified cartilage layer and then microfracture awls, pump pressure decreased, bleeding was confirmed.  Medial side was inspected.  There was a large complex tear of the medial meniscus using basket motorized shaver.  Partial meniscectomy was performed back to stable base and contoured.  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  Large area AP 4 cm mediolateral, 2.5 cm significant area of weightbearing surface of the medial femoral condyle.  It was decided to perform microfracture technique as he had had this at this time to the left knee and helped quite a bit.  Collene Mares was utilized to debride the edges back to smooth edges.  Calcified cartilage layer was removed with curette and standard stem, and microfracture technique was utilized.  The pump pressure was decreased and bleeding was confirmed.  The knee was copiously irrigated and arthroscopic instruments were removed.  The 4 portals were closed with 4-0 nylon suture, and 12 mL of 0.25% Sensorcaine and 4 mg of morphine sulfate were injected into the knee joint.  Sterile dressing was applied, as well as ice pack.  No complications.  He was awakened and transferred to the PACU in stable condition.  He will be stabilized in the PACU, discharged to home on crutches.          ______________________________ Erasmo Leventhal, M.D.     RAC/MEDQ  D:  06/05/2013  T:  06/06/2013  Job:  [id number]

## 2013-06-08 NOTE — Anesthesia Postprocedure Evaluation (Signed)
  Anesthesia Post-op Note  Patient: Francis Herman  Procedure(s) Performed: Procedure(s) (LRB):  Right Knee Arthroscopy with Medial Meniscectomy Chondroplasty and Micro Fracture (Right)  Patient Location: PACU  Anesthesia Type: General  Level of Consciousness: awake and alert   Airway and Oxygen Therapy: Patient Spontanous Breathing  Post-op Pain: mild  Post-op Assessment: Post-op Vital signs reviewed, Patient's Cardiovascular Status Stable, Respiratory Function Stable, Patent Airway and No signs of Nausea or vomiting  Last Vitals:  Filed Vitals:   06/05/13 1715  BP: 109/73  Pulse: 48  Temp: 36 C  Resp: 16    Post-op Vital Signs: stable   Complications: No apparent anesthesia complications

## 2013-06-12 ENCOUNTER — Ambulatory Visit: Payer: PRIVATE HEALTH INSURANCE | Attending: Specialist

## 2013-06-12 DIAGNOSIS — IMO0001 Reserved for inherently not codable concepts without codable children: Secondary | ICD-10-CM | POA: Insufficient documentation

## 2013-06-12 DIAGNOSIS — IMO0002 Reserved for concepts with insufficient information to code with codable children: Secondary | ICD-10-CM | POA: Insufficient documentation

## 2013-06-12 DIAGNOSIS — Z9889 Other specified postprocedural states: Secondary | ICD-10-CM | POA: Insufficient documentation

## 2013-06-12 DIAGNOSIS — Y849 Medical procedure, unspecified as the cause of abnormal reaction of the patient, or of later complication, without mention of misadventure at the time of the procedure: Secondary | ICD-10-CM | POA: Insufficient documentation

## 2013-06-12 DIAGNOSIS — M1710 Unilateral primary osteoarthritis, unspecified knee: Secondary | ICD-10-CM | POA: Insufficient documentation

## 2013-06-13 ENCOUNTER — Ambulatory Visit: Payer: PRIVATE HEALTH INSURANCE | Admitting: Physical Therapy

## 2013-06-14 ENCOUNTER — Ambulatory Visit: Payer: PRIVATE HEALTH INSURANCE

## 2013-06-15 ENCOUNTER — Ambulatory Visit: Payer: PRIVATE HEALTH INSURANCE

## 2013-06-18 ENCOUNTER — Ambulatory Visit: Payer: PRIVATE HEALTH INSURANCE

## 2013-06-19 ENCOUNTER — Ambulatory Visit: Payer: PRIVATE HEALTH INSURANCE | Admitting: Physical Therapy

## 2013-06-20 ENCOUNTER — Encounter: Payer: PRIVATE HEALTH INSURANCE | Admitting: Physical Therapy

## 2013-06-22 ENCOUNTER — Ambulatory Visit: Payer: PRIVATE HEALTH INSURANCE | Attending: Specialist | Admitting: Physical Therapy

## 2013-06-22 DIAGNOSIS — M25669 Stiffness of unspecified knee, not elsewhere classified: Secondary | ICD-10-CM | POA: Insufficient documentation

## 2013-06-22 DIAGNOSIS — M25569 Pain in unspecified knee: Secondary | ICD-10-CM | POA: Insufficient documentation

## 2013-06-22 DIAGNOSIS — R262 Difficulty in walking, not elsewhere classified: Secondary | ICD-10-CM | POA: Insufficient documentation

## 2013-06-22 DIAGNOSIS — IMO0001 Reserved for inherently not codable concepts without codable children: Secondary | ICD-10-CM | POA: Insufficient documentation

## 2013-06-22 DIAGNOSIS — M6281 Muscle weakness (generalized): Secondary | ICD-10-CM | POA: Insufficient documentation

## 2013-06-25 ENCOUNTER — Encounter: Payer: Self-pay | Admitting: Physical Therapy

## 2013-06-25 ENCOUNTER — Ambulatory Visit: Payer: PRIVATE HEALTH INSURANCE | Admitting: Physical Therapy

## 2013-06-26 ENCOUNTER — Ambulatory Visit: Payer: PRIVATE HEALTH INSURANCE | Admitting: Physical Therapy

## 2013-06-27 ENCOUNTER — Ambulatory Visit: Payer: PRIVATE HEALTH INSURANCE | Admitting: Physical Therapy

## 2013-07-02 ENCOUNTER — Ambulatory Visit: Payer: PRIVATE HEALTH INSURANCE | Attending: Specialist

## 2013-07-02 DIAGNOSIS — M25669 Stiffness of unspecified knee, not elsewhere classified: Secondary | ICD-10-CM | POA: Insufficient documentation

## 2013-07-02 DIAGNOSIS — IMO0001 Reserved for inherently not codable concepts without codable children: Secondary | ICD-10-CM | POA: Insufficient documentation

## 2013-07-02 DIAGNOSIS — R262 Difficulty in walking, not elsewhere classified: Secondary | ICD-10-CM | POA: Insufficient documentation

## 2013-07-02 DIAGNOSIS — M25569 Pain in unspecified knee: Secondary | ICD-10-CM | POA: Insufficient documentation

## 2013-07-02 DIAGNOSIS — R609 Edema, unspecified: Secondary | ICD-10-CM | POA: Insufficient documentation

## 2013-07-02 DIAGNOSIS — M6281 Muscle weakness (generalized): Secondary | ICD-10-CM | POA: Insufficient documentation

## 2013-07-03 ENCOUNTER — Ambulatory Visit: Payer: PRIVATE HEALTH INSURANCE

## 2013-07-04 ENCOUNTER — Ambulatory Visit: Payer: PRIVATE HEALTH INSURANCE

## 2013-08-17 ENCOUNTER — Ambulatory Visit: Payer: PRIVATE HEALTH INSURANCE | Attending: Specialist | Admitting: Physical Therapy

## 2013-08-17 DIAGNOSIS — M25569 Pain in unspecified knee: Secondary | ICD-10-CM | POA: Insufficient documentation

## 2013-08-17 DIAGNOSIS — R609 Edema, unspecified: Secondary | ICD-10-CM | POA: Insufficient documentation

## 2013-08-17 DIAGNOSIS — M6281 Muscle weakness (generalized): Secondary | ICD-10-CM | POA: Insufficient documentation

## 2013-08-17 DIAGNOSIS — R262 Difficulty in walking, not elsewhere classified: Secondary | ICD-10-CM | POA: Insufficient documentation

## 2013-08-17 DIAGNOSIS — IMO0001 Reserved for inherently not codable concepts without codable children: Secondary | ICD-10-CM | POA: Insufficient documentation

## 2013-08-17 DIAGNOSIS — M25669 Stiffness of unspecified knee, not elsewhere classified: Secondary | ICD-10-CM | POA: Insufficient documentation

## 2013-08-20 ENCOUNTER — Ambulatory Visit: Payer: PRIVATE HEALTH INSURANCE

## 2013-08-21 ENCOUNTER — Ambulatory Visit: Payer: PRIVATE HEALTH INSURANCE | Attending: Specialist

## 2013-08-21 ENCOUNTER — Ambulatory Visit: Payer: PRIVATE HEALTH INSURANCE

## 2013-08-21 DIAGNOSIS — Y849 Medical procedure, unspecified as the cause of abnormal reaction of the patient, or of later complication, without mention of misadventure at the time of the procedure: Secondary | ICD-10-CM | POA: Insufficient documentation

## 2013-08-21 DIAGNOSIS — Z9889 Other specified postprocedural states: Secondary | ICD-10-CM | POA: Insufficient documentation

## 2013-08-21 DIAGNOSIS — IMO0001 Reserved for inherently not codable concepts without codable children: Secondary | ICD-10-CM | POA: Insufficient documentation

## 2013-08-21 DIAGNOSIS — M1710 Unilateral primary osteoarthritis, unspecified knee: Secondary | ICD-10-CM | POA: Insufficient documentation

## 2013-08-21 DIAGNOSIS — IMO0002 Reserved for concepts with insufficient information to code with codable children: Secondary | ICD-10-CM | POA: Insufficient documentation

## 2013-08-28 ENCOUNTER — Ambulatory Visit: Payer: PRIVATE HEALTH INSURANCE | Attending: Specialist

## 2013-08-28 DIAGNOSIS — IMO0001 Reserved for inherently not codable concepts without codable children: Secondary | ICD-10-CM | POA: Insufficient documentation

## 2013-08-28 DIAGNOSIS — M25669 Stiffness of unspecified knee, not elsewhere classified: Secondary | ICD-10-CM | POA: Insufficient documentation

## 2013-08-28 DIAGNOSIS — R262 Difficulty in walking, not elsewhere classified: Secondary | ICD-10-CM | POA: Insufficient documentation

## 2013-08-28 DIAGNOSIS — M6281 Muscle weakness (generalized): Secondary | ICD-10-CM | POA: Insufficient documentation

## 2013-08-28 DIAGNOSIS — R609 Edema, unspecified: Secondary | ICD-10-CM | POA: Insufficient documentation

## 2013-08-28 DIAGNOSIS — M25569 Pain in unspecified knee: Secondary | ICD-10-CM | POA: Insufficient documentation

## 2013-08-29 ENCOUNTER — Ambulatory Visit: Payer: PRIVATE HEALTH INSURANCE | Admitting: Physical Therapy

## 2013-09-04 ENCOUNTER — Ambulatory Visit: Payer: PRIVATE HEALTH INSURANCE

## 2013-09-05 ENCOUNTER — Ambulatory Visit: Payer: PRIVATE HEALTH INSURANCE

## 2013-09-06 ENCOUNTER — Encounter: Payer: Self-pay | Admitting: Rehabilitation

## 2013-09-13 ENCOUNTER — Ambulatory Visit: Payer: PRIVATE HEALTH INSURANCE | Attending: Specialist

## 2013-09-13 DIAGNOSIS — Z9889 Other specified postprocedural states: Secondary | ICD-10-CM | POA: Insufficient documentation

## 2013-09-13 DIAGNOSIS — IMO0002 Reserved for concepts with insufficient information to code with codable children: Secondary | ICD-10-CM | POA: Insufficient documentation

## 2013-09-13 DIAGNOSIS — M1710 Unilateral primary osteoarthritis, unspecified knee: Secondary | ICD-10-CM | POA: Insufficient documentation

## 2013-09-13 DIAGNOSIS — Y849 Medical procedure, unspecified as the cause of abnormal reaction of the patient, or of later complication, without mention of misadventure at the time of the procedure: Secondary | ICD-10-CM | POA: Insufficient documentation

## 2013-09-13 DIAGNOSIS — IMO0001 Reserved for inherently not codable concepts without codable children: Secondary | ICD-10-CM | POA: Insufficient documentation

## 2013-09-14 ENCOUNTER — Ambulatory Visit: Payer: PRIVATE HEALTH INSURANCE

## 2013-09-17 ENCOUNTER — Ambulatory Visit: Payer: PRIVATE HEALTH INSURANCE

## 2013-09-19 ENCOUNTER — Ambulatory Visit: Payer: PRIVATE HEALTH INSURANCE

## 2013-09-24 ENCOUNTER — Ambulatory Visit: Payer: PRIVATE HEALTH INSURANCE

## 2013-09-28 ENCOUNTER — Ambulatory Visit: Payer: PRIVATE HEALTH INSURANCE

## 2013-10-02 ENCOUNTER — Ambulatory Visit: Payer: PRIVATE HEALTH INSURANCE

## 2014-04-24 ENCOUNTER — Encounter: Payer: Self-pay | Admitting: Family Medicine

## 2014-04-24 ENCOUNTER — Ambulatory Visit (INDEPENDENT_AMBULATORY_CARE_PROVIDER_SITE_OTHER): Payer: 59 | Admitting: Family Medicine

## 2014-04-24 VITALS — BP 130/80 | HR 93 | Temp 98.5°F | Resp 16 | Ht 72.5 in | Wt 253.6 lb

## 2014-04-24 DIAGNOSIS — M175 Other unilateral secondary osteoarthritis of knee: Secondary | ICD-10-CM

## 2014-04-24 DIAGNOSIS — M1731 Unilateral post-traumatic osteoarthritis, right knee: Secondary | ICD-10-CM

## 2014-04-24 DIAGNOSIS — R0609 Other forms of dyspnea: Secondary | ICD-10-CM

## 2014-04-24 DIAGNOSIS — B360 Pityriasis versicolor: Secondary | ICD-10-CM

## 2014-04-24 DIAGNOSIS — E669 Obesity, unspecified: Secondary | ICD-10-CM

## 2014-04-24 DIAGNOSIS — R0681 Apnea, not elsewhere classified: Secondary | ICD-10-CM

## 2014-04-24 DIAGNOSIS — R0989 Other specified symptoms and signs involving the circulatory and respiratory systems: Secondary | ICD-10-CM

## 2014-04-24 DIAGNOSIS — R0683 Snoring: Secondary | ICD-10-CM

## 2014-04-24 MED ORDER — KETOCONAZOLE 200 MG PO TABS: 200.0000 mg | ORAL_TABLET | Freq: Every day | ORAL | Status: DC

## 2014-04-24 NOTE — Progress Notes (Signed)
Subjective:    Patient ID: Francis Herman, male    DOB: [birth year], 33 y.o.   MRN: [id number]  This chart was scribed for Ethelda Chick, MD by Tonye Royalty, ED Scribe. This patient was seen in room 21 and the patient's care was started at 4:27 PM.   04/24/2014  pt here to establish care and rash on hands comes and goes   HPI HPI Comments: Francis Herman is a 33 y.o. male who presents to the [facility] to establish care. He states he has witnessed apnea, only after alcohol use, and needs a sleep study. He states he was drinking for pain to his right knee. He reports surgery on his right knee for workplace injury last October 2014 without good recovery since then and progressing arthritis.   He also reports an intermittent rash to his hands and neck that comes in the summers; he was prescribed a medication for this rash last summer but he did not fill his prescription. He applies Selsun Blue to the rash with temporary relief but rash always recurs every summer. This summer, Selsun Blue has not been as effective.  Rash only located to B hands and lateral aspect of neck on L.    He also reports back pain due to change in gait. He reports stress from not being able to work due to his knee pain and having to rely on his family; he states he feels some depressed sometimes. He reports 25 pound weight gain in the past year. He denies suicidal ideation. He states he has not seen a counselor but the thought has crossed his mind.  He states his last physical was last June. He states he has not had a colonoscopy. He states his immunizations are up to date. He reports an eye exam this week with no glaucoma or cataracts  . He denies overnight hospitalizations.   He states his mother is 33 with no health problems; His father is 27 and has pseudo-Parkinson's disease. He states his siblings do not have health problems.   He states he is married with 2 children. He states he moved here from  [place] 16-17 years ago. He states he is not working at this time due to his knee, but he was a Engineer, civil (consulting). He denies smoking, reports heavy alcohol use, and reports biking for exercise. He states he is taking ASA.  Review of Systems  Constitutional: Positive for unexpected weight change. Negative for chills, diaphoresis and fatigue.  Respiratory: Positive for apnea. Negative for cough and shortness of breath.   Cardiovascular: Negative for chest pain and leg swelling.  Musculoskeletal: Positive for arthralgias (right knee) and back pain.  Skin: Positive for rash.  Neurological: Negative for headaches.  Psychiatric/Behavioral: Positive for dysphoric mood. Negative for suicidal ideas. The patient is not nervous/anxious.        Depression    Past Medical History  Diagnosis Date  . Acute meniscal tear of right knee   . Wears glasses   . Arthritis     R OA knee   Past Surgical History  Procedure Laterality Date  . Knee arthroscopy with meniscal repair Left 2009  . Knee arthroscopy with medial menisectomy Right 06/05/2013    Procedure:  Right Knee Arthroscopy with Medial Meniscectomy Chondroplasty and Micro Fracture;  Surgeon: Eugenia Mcalpine, MD;  Location: [facility] ;  Service: Orthopedics;  Laterality: Right;   No Known Allergies Current Outpatient Prescriptions  Medication Sig Dispense Refill  .  aspirin EC 325 MG tablet Take 1 tablet (325 mg total) by mouth 2 (two) times daily.  60 tablet  0  . OVER THE COUNTER MEDICATION OTC Omega 3 taking      . OVER THE COUNTER MEDICATION OTC Vitamin D 3 taking      . celecoxib (CELEBREX) 200 MG capsule Take 200 mg by mouth as needed for pain.      Marland Kitchen HYDROcodone-acetaminophen (NORCO) 7.5-325 MG per tablet Take 1 tablet by mouth every 6 (six) hours as needed for pain.  50 tablet  0  . ketoconazole (NIZORAL) 200 MG tablet Take 1 tablet (200 mg total) by mouth daily.  10 tablet  0   No current facility-administered medications for this  visit.   History   Social History  . Marital Status: Married    Spouse Name: N/A    Number of Children: N/A  . Years of Education: N/A   Occupational History  . Not on file.   Social History Main Topics  . Smoking status: Never Smoker   . Smokeless tobacco: Never Used  . Alcohol Use: 1.5 oz/week    3 drink(s) per week  . Drug Use: No  . Sexual Activity: Not on file   Other Topics Concern  . Not on file   Social History Narrative   Marital status: married; from [place]; [place] since 2000      Children: 2 children      Employment:  Unemployed; Charity fundraiser in ICU and OR      Lives: with wife, 2 children.       Tobacco: none      Alcohol:  2-3 glasses of vodka      Exercise: biking         Family History  Problem Relation Age of Onset  . Arthritis Maternal Grandmother   . Hypertension Maternal Grandfather   . Parkinsonism Father   . Hyperlipidemia Father        Objective:    BP 130/80  Pulse 93  Temp(Src) 98.5 F (36.9 C) (Oral)  Resp 16  Ht 6' 0.5" (1.842 m)  Wt 253 lb 9.6 oz (115.032 kg)  BMI 33.90 kg/m2  SpO2 98% Physical Exam  Nursing note and vitals reviewed. Constitutional: He is oriented to person, place, and time. He appears well-developed and well-nourished. No distress.  obese  HENT:  Head: Normocephalic and atraumatic.  Eyes: Conjunctivae and EOM are normal. Pupils are equal, round, and reactive to light.  Neck: Normal range of motion. Neck supple. Carotid bruit is not present. No tracheal deviation present. No thyromegaly present.  Cardiovascular: Normal rate, regular rhythm, normal heart sounds and intact distal pulses.  Exam reveals no gallop and no friction rub.   No murmur heard. Pulmonary/Chest: Effort normal and breath sounds normal. No respiratory distress. He has no wheezes. He has no rales.  Musculoskeletal: Normal range of motion.  Lymphadenopathy:    He has no cervical adenopathy.  Neurological: He is alert and oriented to person, place, and  time. No cranial nerve deficit.  Skin: Skin is warm and dry. Rash noted. He is not diaphoretic.  Superficial scaling hypopigmented rash on bilateral hands and L lateral neck with defined border  Psychiatric: He has a normal mood and affect. His behavior is normal.   Results for orders placed during the hospital encounter of 06/05/13  POCT HEMOGLOBIN-HEMACUE      Result Value Ref Range   Hemoglobin 14.5  13.0 - 17.0 g/dL  Assessment & Plan:   1. Snoring   2. Apnea   3. Tinea versicolor   4. Post-traumatic osteoarthritis of right knee   5. Obesity, unspecified     1. Snoring with witnessed apnea:  New. With weight gain in past year; refer for sleep study. 2.  Tinea versicolor:  Worsening; rx for Ketoconazole tablets one daily for 7-10 days; continue Selsun Blue topically. If no improvement in one month, refer to dermatology. 3.  Post-traumatic OA R knee:  New; s/p meniscus repair one year ago with chronic persistent pain; currently unable to work. 4.  Grief reaction: New. Secondary to chronic daily R knee pain and inability to work; monitor mood closely; high risk for development of depression. Declined counseling.  5. Obesity:  New.  Recommend weight loss, exercise, low-fat food choices.   Meds ordered this encounter  Medications  . OVER THE COUNTER MEDICATION    Sig: OTC Omega 3 taking  . OVER THE COUNTER MEDICATION    Sig: OTC Vitamin D 3 taking  . DISCONTD: ketoconazole (NIZORAL) 200 MG tablet    Sig: Take 1 tablet (200 mg total) by mouth daily.    Dispense:  10 tablet    Refill:  0  . ketoconazole (NIZORAL) 200 MG tablet    Sig: Take 1 tablet (200 mg total) by mouth daily.    Dispense:  10 tablet    Refill:  0    Return if symptoms worsen or fail to improve.   I personally performed the services described in this documentation, which was scribed in my presence.  The recorded information has been reviewed and is accurate.  Nilda Simmer, M.D.  [facility] [address] [place], [place]  [address] [phone] phone [phone] fax

## 2014-04-26 ENCOUNTER — Institutional Professional Consult (permissible substitution): Payer: 59 | Admitting: Neurology

## 2014-04-26 ENCOUNTER — Telehealth: Payer: Self-pay | Admitting: *Deleted

## 2014-04-26 DIAGNOSIS — E669 Obesity, unspecified: Secondary | ICD-10-CM | POA: Insufficient documentation

## 2014-04-26 NOTE — Telephone Encounter (Signed)
Patient is a no show for today's appointment  (9:30-09/18/15)

## 2014-04-29 ENCOUNTER — Encounter: Payer: Self-pay | Admitting: Neurology

## 2014-04-29 ENCOUNTER — Ambulatory Visit (INDEPENDENT_AMBULATORY_CARE_PROVIDER_SITE_OTHER): Payer: 59 | Admitting: Neurology

## 2014-04-29 VITALS — BP 134/79 | HR 65 | Temp 98.1°F | Ht 73.0 in | Wt 253.0 lb

## 2014-04-29 DIAGNOSIS — E669 Obesity, unspecified: Secondary | ICD-10-CM

## 2014-04-29 DIAGNOSIS — G4733 Obstructive sleep apnea (adult) (pediatric): Secondary | ICD-10-CM

## 2014-04-29 NOTE — Progress Notes (Signed)
Subjective:    Patient ID: Francis Herman is a 33 y.o. male.  HPI    Huston Foley, MD, PhD [facility] [address], [address] [address] [place], [place] [address]  Dear Dr. Katrinka Blazing,  I saw your patient, Francis Herman, upon your kind request in my neurologic clinic today for initial consultation of his sleep disorder, in particular, concern for underlying obstructive sleep apnea. The patient is unaccompanied today. As you know, Mr. Francis Herman is a 33 year old right-handed gentleman with an underlying medical history of right knee osteoarthritis, and obesity, who reports snoring and witnessed apneas (per wife), as well as excessive daytime somnolence.  His typical bedtime is reported to be around MN to 1 AM and usual wake time is around 7 AM. Sleep onset typically occurs within minutes. He reports feeling adequately rested upon awakening. He wakes up on an average 1 times in the middle of the night and has to go to the bathroom 0 times on a typical night. He denies morning headaches.  He reports excessive daytime somnolence (EDS) and His Epworth Sleepiness Score (ESS) is 9/24 today. He has not fallen asleep while driving. The patient has been taking a scheduled nap, which is usually 45 minutes long. He reports dreaming in a nap and reports feeling refreshed after a nap.  He has been known to snore for the past many years. Snoring is reportedly severe, and associated with choking sounds and witnessed apneas. The patient reports not waking with a sense of choking or strangling feeling. There is no report of nighttime reflux, with no nighttime cough experienced. The patient has not noted any RLS symptoms and is not known to kick while asleep or before falling asleep. There is no family history of RLS or OSA.  He is not a restless sleeper.   He denies cataplexy, hypnagogic or hypnopompic hallucinations, or sleep attacks, but has had sleep paralysis. He does not report any vivid dreams,  nightmares, dream enactments, but has had sleep talking as an adult and has had sleep walking as a child. He has not had a sleep study or a home sleep test.  He consumes 0 to 1 caffeinated beverages per day, usually in the form of soda. He does not smoke and occasionally drinks alcohol.  There is a TV in the bedroom and usually it is on at night, on a timer. They have one dog, usually not in their bed, and a 47 yo and a 56 mo old.   His Past Medical History Is Significant For: Past Medical History  Diagnosis Date  . Acute meniscal tear of right knee   . Wears glasses   . Arthritis     R OA knee  . High cholesterol     His Past Surgical History Is Significant For: Past Surgical History  Procedure Laterality Date  . Knee arthroscopy with meniscal repair Left 2009  . Knee arthroscopy with medial menisectomy Right 06/05/2013    Procedure:  Right Knee Arthroscopy with Medial Meniscectomy Chondroplasty and Micro Fracture;  Surgeon: Eugenia Mcalpine, MD;  Location: [facility];  Service: Orthopedics;  Laterality: Right;    His Family History Is Significant For: Family History  Problem Relation Age of Onset  . Arthritis Maternal Grandmother   . Hypertension Maternal Grandfather   . Parkinsonism Father   . Hyperlipidemia Father   . High Cholesterol Father   . Syncope episode Father     His Social History Is Significant For: History  Social History  . Marital Status: Married    Spouse Name: Francis Herman    Number of Children: Francis Herman  . Years of Education: Francis Herman   Social History Main Topics  . Smoking status: Never Smoker   . Smokeless tobacco: Never Used  . Alcohol Use: 1.5 oz/week    3 drink(s) per week     Comment: socially  . Drug Use: No  . Sexual Activity: Not on file   Other Topics Concern  . Not on file   Social History Narrative   Marital status: married; from [place]; [place] since 2000      Children: 2 children      Employment:  Unemployed; Charity fundraiser in ICU and OR       Lives: with wife, 2 children.       Tobacco: none      Alcohol:  2-3 glasses of vodka      Exercise: biking          His Allergies Are:  No Known Allergies:   His Current Medications Are:  Outpatient Encounter Prescriptions as of 04/29/2014  Medication Sig  . aspirin EC 325 MG tablet Take 1 tablet (325 mg total) by mouth 2 (two) times daily.  Marland Kitchen HYDROcodone-acetaminophen (NORCO) 7.5-325 MG per tablet Take 1 tablet by mouth every 6 (six) hours as needed for pain.  Marland Kitchen ketoconazole (NIZORAL) 200 MG tablet Take 1 tablet (200 mg total) by mouth daily.  Marland Kitchen OVER THE COUNTER MEDICATION OTC Omega 3 taking  . OVER THE COUNTER MEDICATION OTC Vitamin D 3 taking  . [DISCONTINUED] celecoxib (CELEBREX) 200 MG capsule Take 200 mg by mouth as needed for pain.  :  Review of Systems:  Out of a complete 14 point review of systems, all are reviewed and negative with the exception of these symptoms as listed below:   Review of Systems  Constitutional:       Weight gain  Eyes:       Blurred vision  Respiratory:       Snoring  Musculoskeletal: Positive for joint swelling.       Joint pain  Psychiatric/Behavioral:       Racing thoughts    Objective:  Neurologic Exam  Physical Exam Physical Examination:   Filed Vitals:   04/29/14 0849  BP: 134/79  Pulse: 65  Temp: 98.1 F (36.7 C)    General Examination: The patient is a very pleasant 33 y.o. male in no acute distress. He appears well-developed and well-nourished and well groomed.   HEENT: Normocephalic, atraumatic, pupils are equal, round and reactive to light and accommodation. Funduscopic exam is normal with sharp disc margins noted. Extraocular tracking is good without limitation to gaze excursion or nystagmus noted. Normal smooth pursuit is noted. Hearing is grossly intact. Tympanic membranes are clear bilaterally. Face is symmetric with normal facial animation and normal facial sensation. Speech is clear with no dysarthria noted. There  is no hypophonia. There is no lip, neck/head, jaw or voice tremor. Neck is supple with full range of passive and active motion. There are no carotid bruits on auscultation. Oropharynx exam reveals: mild mouth dryness, good dental hygiene and marked airway crowding, due to large tongue and redundant and thick uvula, which is trimmed (as a child). Mallampati is class II. Tongue protrudes centrally and palate elevates symmetrically. Tonsils are absent. Neck size is 16 inches. He has an absent overbite. Nasal inspection reveals no significant nasal mucosal bogginess or redness and no septal deviation.   Chest:  Clear to auscultation without wheezing, rhonchi or crackles noted.  Heart: S1+S2+0, regular and normal without murmurs, rubs or gallops noted.   Abdomen: Soft, non-tender and non-distended with normal bowel sounds appreciated on auscultation.  Extremities: There is no pitting edema in the distal lower extremities bilaterally. Pedal pulses are intact.  Skin: Warm and dry without trophic changes noted. There are no varicose veins.  Musculoskeletal: exam reveals no obvious joint deformities, tenderness or joint swelling or erythema.   Neurologically:  Mental status: The patient is awake, alert and oriented in all 4 spheres. His immediate and remote memory, attention, language skills and fund of knowledge are appropriate. There is no evidence of aphasia, agnosia, apraxia or anomia. Speech is clear with normal prosody and enunciation. Thought process is linear. Mood is normal and affect is normal.  Cranial nerves II - XII are as described above under HEENT exam. In addition: shoulder shrug is normal with equal shoulder height noted. Motor exam: Normal bulk, strength and tone is noted. There is no drift, tremor or rebound. Romberg is not tested, due to R leg discomfort and insecurity with standing without a cane. Reflexes are 2+ throughout. Babinski: Toes are flexor bilaterally. Fine motor skills and  coordination: intact with normal finger taps, normal hand movements, normal rapid alternating patting, normal foot taps and normal foot agility.  Cerebellar testing: No dysmetria or intention tremor on finger to nose testing. Heel to shin is unremarkable bilaterally. There is no truncal or gait ataxia.  Sensory exam: intact to light touch, pinprick, vibration, temperature sense in the upper and lower extremities.  Gait, station and balance: He stands with difficulty and feels his R leg gives out. No veering to one side is noted. No leaning to one side is noted. Posture is age-appropriate and stance is narrow based. Gait shows slow walking and a limp on the R. He turns slowly. Tandem walk is not tested nor toe and heel stance.               Assessment and Plan:   In summary, Elan Brainerd is a very pleasant 33 y.o.-year old male with an underlying medical history of right knee osteoarthritis, and obesity, whose history and physical exam are indeed concerning for obstructive sleep apnea (OSA). I had a long chat with the patient about my findings and the diagnosis of OSA, its prognosis and treatment options. We talked about medical treatments, surgical interventions and non-pharmacological approaches. I explained in particular the risks and ramifications of untreated moderate to severe OSA, especially with respect to developing cardiovascular disease down the Road, including congestive heart failure, difficult to treat hypertension, cardiac arrhythmias, or stroke. Even type 2 diabetes has, in part, been linked to untreated OSA. Symptoms of untreated OSA include daytime sleepiness, memory problems, mood irritability and mood disorder such as depression and anxiety, lack of energy, as well as recurrent headaches, especially morning headaches. We talked about trying to maintain a healthy lifestyle in general, as well as the importance of weight control. I encouraged the patient to eat healthy, exercise daily and  keep well hydrated, to keep a scheduled bedtime and wake time routine, to not skip any meals and eat healthy snacks in between meals. I advised the patient not to drive when feeling sleepy. I recommended the following at this time: sleep study with potential positive airway pressure titration. (We will score hypopneas at 3% and split the sleep study into diagnostic and treatment portion, if the estimated. 2 hour AHI is >  15/h).   I explained the sleep test procedure to the patient and also outlined possible surgical and non-surgical treatment options of OSA, including the use of a custom-made dental device (which would require a referral to a specialist dentist or oral surgeon), upper airway surgical options, such as pillar implants, radiofrequency surgery, tongue base surgery, and UPPP (which would involve a referral to an ENT surgeon). Rarely, jaw surgery such as mandibular advancement may be considered.  I also explained the CPAP treatment option to the patient, who indicated that he would be willing to try CPAP if the need arises. I explained the importance of being compliant with PAP treatment, not only for insurance purposes but primarily to improve His symptoms, and for the patient's long term health benefit, including to reduce His cardiovascular risks. I answered all his questions today and the patient was in agreement. I would like to see him back after the sleep study is completed and encouraged him to call with any interim questions, concerns, problems or updates.   Thank you very much for allowing me to participate in the care of this nice patient. If I can be of any further assistance to you please do not hesitate to call me at [phone].  Sincerely,   Huston Foley, MD, PhD

## 2014-04-29 NOTE — Patient Instructions (Signed)

## 2014-05-17 ENCOUNTER — Institutional Professional Consult (permissible substitution): Payer: PRIVATE HEALTH INSURANCE | Admitting: Pulmonary Disease

## 2014-09-20 ENCOUNTER — Encounter (HOSPITAL_COMMUNITY): Payer: Self-pay | Admitting: Emergency Medicine

## 2014-09-20 ENCOUNTER — Emergency Department (HOSPITAL_COMMUNITY)
Admission: EM | Admit: 2014-09-20 | Discharge: 2014-09-20 | Disposition: A | Discharge status: Home / Self Care | Payer: Managed Care, Other (non HMO) | Attending: Emergency Medicine | Admitting: Emergency Medicine

## 2014-09-20 ENCOUNTER — Emergency Department (HOSPITAL_COMMUNITY): Payer: Managed Care, Other (non HMO)

## 2014-09-20 ENCOUNTER — Encounter: Payer: Self-pay | Admitting: Neurology

## 2014-09-20 DIAGNOSIS — Y998 Other external cause status: Secondary | ICD-10-CM | POA: Diagnosis not present

## 2014-09-20 DIAGNOSIS — Y92410 Unspecified street and highway as the place of occurrence of the external cause: Secondary | ICD-10-CM | POA: Insufficient documentation

## 2014-09-20 DIAGNOSIS — Z23 Encounter for immunization: Secondary | ICD-10-CM | POA: Insufficient documentation

## 2014-09-20 DIAGNOSIS — Y9389 Activity, other specified: Secondary | ICD-10-CM | POA: Insufficient documentation

## 2014-09-20 DIAGNOSIS — S80812A Abrasion, left lower leg, initial encounter: Secondary | ICD-10-CM | POA: Insufficient documentation

## 2014-09-20 DIAGNOSIS — S86912A Strain of unspecified muscle(s) and tendon(s) at lower leg level, left leg, initial encounter: Secondary | ICD-10-CM | POA: Insufficient documentation

## 2014-09-20 DIAGNOSIS — F10120 Alcohol abuse with intoxication, uncomplicated: Secondary | ICD-10-CM | POA: Diagnosis not present

## 2014-09-20 DIAGNOSIS — E041 Nontoxic single thyroid nodule: Secondary | ICD-10-CM | POA: Diagnosis not present

## 2014-09-20 DIAGNOSIS — T148XXA Other injury of unspecified body region, initial encounter: Secondary | ICD-10-CM

## 2014-09-20 DIAGNOSIS — F10920 Alcohol use, unspecified with intoxication, uncomplicated: Secondary | ICD-10-CM

## 2014-09-20 DIAGNOSIS — S8992XA Unspecified injury of left lower leg, initial encounter: Secondary | ICD-10-CM | POA: Diagnosis present

## 2014-09-20 LAB — I-STAT CHEM 8, ED
BUN: 19 mg/dL (ref 6–23)
CHLORIDE: 101 mmol/L (ref 96–112)
Calcium, Ion: 1.07 mmol/L — ABNORMAL LOW (ref 1.12–1.23)
Creatinine, Ser: 1.4 mg/dL — ABNORMAL HIGH (ref 0.50–1.35)
Glucose, Bld: 169 mg/dL — ABNORMAL HIGH (ref 70–99)
HCT: 50 % (ref 39.0–52.0)
Hemoglobin: 17 g/dL (ref 13.0–17.0)
Potassium: 3 mmol/L — ABNORMAL LOW (ref 3.5–5.1)
Sodium: 140 mmol/L (ref 135–145)
TCO2: 19 mmol/L (ref 0–100)

## 2014-09-20 LAB — CBC
HCT: 43.3 % (ref 39.0–52.0)
Hemoglobin: 14.7 g/dL (ref 13.0–17.0)
MCH: 29.6 pg (ref 26.0–34.0)
MCHC: 33.9 g/dL (ref 30.0–36.0)
MCV: 87.1 fL (ref 78.0–100.0)
PLATELETS: 250 10*3/uL (ref 150–400)
RBC: 4.97 MIL/uL (ref 4.22–5.81)
RDW: 13 % (ref 11.5–15.5)
WBC: 9.6 10*3/uL (ref 4.0–10.5)

## 2014-09-20 LAB — PROTIME-INR
INR: 1.01 (ref 0.00–1.49)
Prothrombin Time: 13.4 seconds (ref 11.6–15.2)

## 2014-09-20 LAB — COMPREHENSIVE METABOLIC PANEL
ALBUMIN: 3.8 g/dL (ref 3.5–5.2)
ALT: 20 U/L (ref 0–53)
AST: 23 U/L (ref 0–37)
Alkaline Phosphatase: 60 U/L (ref 39–117)
Anion gap: 9 (ref 5–15)
BUN: 15 mg/dL (ref 6–23)
CALCIUM: 8.6 mg/dL (ref 8.4–10.5)
CO2: 22 mmol/L (ref 19–32)
Chloride: 105 mmol/L (ref 96–112)
Creatinine, Ser: 0.98 mg/dL (ref 0.50–1.35)
GFR calc Af Amer: >90 mL/min (ref >90)
GFR calc non Af Amer: >90 mL/min (ref >90)
GLUCOSE: 168 mg/dL — AB (ref 70–99)
POTASSIUM: 3 mmol/L — AB (ref 3.5–5.1)
SODIUM: 136 mmol/L (ref 135–145)
Total Bilirubin: 0.3 mg/dL (ref 0.3–1.2)
Total Protein: 7.1 g/dL (ref 6.0–8.3)

## 2014-09-20 LAB — SAMPLE TO BLOOD BANK

## 2014-09-20 LAB — RAPID URINE DRUG SCREEN, HOSP PERFORMED
Amphetamines: NOT DETECTED
Barbiturates: NOT DETECTED
Benzodiazepines: NOT DETECTED
Cocaine: NOT DETECTED
Opiates: NOT DETECTED
Tetrahydrocannabinol: NOT DETECTED

## 2014-09-20 LAB — CBG MONITORING, ED: GLUCOSE-CAPILLARY: 148 mg/dL — AB (ref 70–99)

## 2014-09-20 LAB — CDS SEROLOGY

## 2014-09-20 LAB — ETHANOL: Alcohol, Ethyl (B): 303 mg/dL — ABNORMAL HIGH (ref 0–9)

## 2014-09-20 MED ORDER — KETOROLAC TROMETHAMINE 30 MG/ML IJ SOLN
30.0000 mg | Freq: Once | INTRAMUSCULAR | Status: AC
  Administered 2014-09-20: 30 mg via INTRAVENOUS
  Filled 2014-09-20: qty 1

## 2014-09-20 MED ORDER — METHOCARBAMOL 500 MG PO TABS
1000.0000 mg | ORAL_TABLET | Freq: Once | ORAL | Status: AC
  Administered 2014-09-20: 1000 mg via ORAL
  Filled 2014-09-20: qty 2

## 2014-09-20 MED ORDER — METHOCARBAMOL 500 MG PO TABS
500.0000 mg | ORAL_TABLET | Freq: Two times a day (BID) | ORAL | Status: DC
Start: 2014-09-20 — End: 2016-04-08

## 2014-09-20 MED ORDER — MELOXICAM 15 MG PO TABS: 15.0000 mg | ORAL_TABLET | Freq: Every day | ORAL | Status: DC

## 2014-09-20 MED ORDER — TETANUS-DIPHTH-ACELL PERTUSSIS 5-2.5-18.5 LF-MCG/0.5 IM SUSP
0.5000 mL | Freq: Once | INTRAMUSCULAR | Status: AC
  Administered 2014-09-20: 0.5 mL via INTRAMUSCULAR

## 2014-09-20 MED ORDER — SODIUM CHLORIDE 0.9 % IV BOLUS (SEPSIS)
1000.0000 mL | Freq: Once | INTRAVENOUS | Status: AC
  Administered 2014-09-20: 1000 mL via INTRAVENOUS

## 2014-09-20 MED ORDER — TETANUS-DIPHTH-ACELL PERTUSSIS 5-2.5-18.5 LF-MCG/0.5 IM SUSP
INTRAMUSCULAR | Status: AC
  Filled 2014-09-20: qty 0.5

## 2014-09-20 MED ORDER — IOHEXOL 300 MG/ML  SOLN
100.0000 mL | Freq: Once | INTRAMUSCULAR | Status: AC | PRN
  Administered 2014-09-20: 100 mL via INTRAVENOUS
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
## 2014-09-20 NOTE — ED Notes (Signed)
Patient ripped IV out.

## 2016-04-07 ENCOUNTER — Ambulatory Visit: Payer: Self-pay | Admitting: Family Medicine

## 2016-04-08 ENCOUNTER — Ambulatory Visit (INDEPENDENT_AMBULATORY_CARE_PROVIDER_SITE_OTHER): Payer: 59 | Admitting: Primary Care

## 2016-04-08 ENCOUNTER — Encounter: Payer: Self-pay | Admitting: Primary Care

## 2016-04-08 ENCOUNTER — Ambulatory Visit: Payer: Self-pay | Admitting: Family Medicine

## 2016-04-08 DIAGNOSIS — M25569 Pain in unspecified knee: Secondary | ICD-10-CM

## 2016-04-08 DIAGNOSIS — G8929 Other chronic pain: Secondary | ICD-10-CM

## 2016-04-08 NOTE — Patient Instructions (Signed)
Stop by the front and speak with Robin regarding your FMLA paperwork.   It was a pleasure to meet you today! Please don't hesitate to call me with any questions. Welcome to [facility]!

## 2016-04-08 NOTE — Progress Notes (Signed)
Subjective:    Patient ID: Francis Herman, male    DOB: 10/27/1980, 35 y.o.   MRN: [id number]  HPI  Francis Herman is a 35 year old male who presents today to establish care and discuss the problems mentioned below. Will obtain old records.  1) Arthritis: Requesting intermittent FMLA paperwork. Diagnosed in 2009. Chronic pain to the right knee with swelling. Surgery in October 2014 for meniscal tear repair, was out of work for 1 year. He was told that he needs a total knee replacement in the near future.   He's undergone numerous treatment options with physical therapy and injections without improvement. He mostly just manages his pain with conservative treatment including the brace, elevation, rest.  He typically misses work 1-2 times monthly for his knee flares. He does not currently follow with his orthopedist at this time, but plans on scheduling an appointment in the near future. Previously managed by [facility] Thomasena [facility]ollins [facility].  He experiences swelling and pain most every day. He does not take anything OTC for his symptoms. He occasionally wears a brace.   2) DUI: DUI in 2016. Needs to complete Chemical Substance classes. He does not have his drivers license and will need to complete these classes in order to resume driving. These classes are offered at 2 pm only. He has 40 hours of classes to complete. They offer these classes most days of the week. He plans to attend Chemical substance classes three days weekly (M,W,F) and Alcoholics Anonymous meetings on Tuesdays, and evaluation with the MD on Thursday. He will need 4 hours of travel time. He has attended an evaluation for DUI classes starting July 1st.  3) Custody Cases: Divorced in 2015-16. He is needing time off for mediation for court, child custody case, once to twice monthly for the next 1 year. He will need 6 hours off total. He has already started mediation for court in July 1st.  Review of Systems  Constitutional:  Negative for unexpected weight change.  Respiratory: Negative for shortness of breath.   Cardiovascular: Negative for chest pain.  Musculoskeletal: Positive for arthralgias and joint swelling.  Neurological: Negative for numbness.  Psychiatric/Behavioral: The patient is not nervous/anxious.        Past Medical History:  Diagnosis Date  . Acute meniscal tear of right knee   . Alcohol abuse   . Arthritis    R OA knee  . High cholesterol   . Wears glasses      Social History   Social History  . Marital status: Married    Spouse name: N/A  . Number of children: N/A  . Years of education: N/A   Occupational History  . Not on file.   Social History Main Topics  . Smoking status: Never Smoker  . Smokeless tobacco: Not on file  . Alcohol use 1.5 oz/week    3 drink(s) per week     Comment: socially  . Drug use: No  . Sexual activity: Not on file   Other Topics Concern  . Not on file   Social History Narrative   ** Merged History Encounter **       Divorced; from [place]; [place] since 2000      Children: 2 children      Employment: Works for [employer].       Tobacco: none      Exercise: biking   Enjoys spending time with his children, watching sports.  Past Surgical History:  Procedure Laterality Date  . KNEE ARTHROSCOPY WITH MEDIAL MENISECTOMY Right 06/05/2013   Procedure:  Right Knee Arthroscopy with Medial Meniscectomy Chondroplasty and Micro Fracture;  Surgeon: Eugenia Mcalpine, MD;  Location: [facility];  Service: Orthopedics;  Laterality: Right;  . KNEE ARTHROSCOPY WITH MENISCAL REPAIR Left 2009    Family History  Problem Relation Age of Onset  . Arthritis Maternal Grandmother   . Hypertension Maternal Grandfather   . Parkinsonism Father   . Hyperlipidemia Father   . Syncope episode Father     No Known Allergies  No current outpatient prescriptions on file prior to visit.   No current facility-administered medications  on file prior to visit.     BP 112/68   Pulse 67   Temp 98.2 F (36.8 C) (Oral)   Ht 6' 0.25" (1.835 m)   Wt 226 lb 1.9 oz (102.6 kg)   SpO2 98%   BMI 30.46 kg/m    Objective:   Physical Exam  Constitutional: He is oriented to person, place, and time. He appears well-nourished.  Neck: Neck supple.  Cardiovascular: Normal rate and regular rhythm.   Pulmonary/Chest: Effort normal and breath sounds normal. He has no wheezes. He has no rales.  Musculoskeletal:       Right knee: He exhibits decreased range of motion and swelling. Tenderness found. Medial joint line tenderness noted.       Left knee: He exhibits decreased range of motion. He exhibits no swelling.  Neurological: He is alert and oriented to person, place, and time.  Skin: Skin is warm and dry.  Psychiatric: He has a normal mood and affect.          Assessment & Plan:  DUI classes and court meetings:  Requesting FMLA for free time to complete require DUI classes and court meetings. FMLA coordinator contacted [employer] company stating that patient will need to obtain paperwork for these requests from the Court office. Patient aware that FMLA will not cover excuse from work for these reasons. He will obtain the proper paperwork.  Morrie Sheldon, NP

## 2016-04-08 NOTE — Assessment & Plan Note (Signed)
Located to bilateral knees since 2012. Completed surgery in 2014 for meniscal tear repair. Will likely need total knee replacement to right knee eventually. Requesting FMLA as he is out of work 1-2 times monthly for flares. FMLA paperwork completed. Recommended reevaluation with orthopedic surgeon for further recommendations.

## 2016-04-08 NOTE — Progress Notes (Signed)
Pre visit review using our clinic review tool, if applicable. No additional management support is needed unless otherwise documented below in the visit note. 

## 2016-04-26 ENCOUNTER — Telehealth: Payer: Self-pay | Admitting: Primary Care

## 2016-04-26 NOTE — Telephone Encounter (Signed)
Noted, changes made and placed on Robin's desk.

## 2016-04-26 NOTE — Telephone Encounter (Signed)
Pt called stating part A question will the patient need to have treatment visit at twice per year due to the condition needed to be marked YES.  [facility] [place] and he confirmed this needed to be yes. Please change to YES initial and date

## 2016-04-27 NOTE — Telephone Encounter (Signed)
Paperwork faxed °Pt aware °Copy for file  °Copy for scan °Copy for pt °

## 2016-06-18 ENCOUNTER — Telehealth: Payer: Self-pay

## 2016-06-18 NOTE — Telephone Encounter (Signed)
Pt wants to add additional days to be out of work per month; pt request at least to be out of work 4 days per month if needed for knee pain. Pt request cb next week.

## 2016-06-18 NOTE — Telephone Encounter (Signed)
Pt left v/m requesting additional days for FMLA. Pt has been out of work 2 days and pt is still in pain. Left v/m requesting cb to get additional info.

## 2016-06-21 NOTE — Telephone Encounter (Signed)
Zella BallRobin, have you seen any additional paperwork?

## 2016-06-22 NOTE — Telephone Encounter (Signed)
Spoke to pt he will faxed paperwork

## 2016-06-24 NOTE — Telephone Encounter (Signed)
fmla paperwork in Kate's IN BOX °For review and signature °

## 2016-06-24 NOTE — Telephone Encounter (Signed)
Completed and placed on Robins desk. 

## 2016-06-25 NOTE — Telephone Encounter (Signed)
Paperwork faxed °Pt aware °Copy for pt °Copy for file °Copy for scan °

## 2016-07-07 NOTE — Telephone Encounter (Signed)
Pt called stating he needed the question:  Will the patient need to have treatment visits at least twice per year due to the condition  Changed from no to yes before they will approve his fmla paperwork  In Kate's IN BOX

## 2016-07-08 NOTE — Telephone Encounter (Signed)
Paperwork faxed °

## 2016-07-08 NOTE — Telephone Encounter (Signed)
Made changes as requested. Agree that he should be evaluated at least twice annually. He will be due for re-evaluation in late February 2018, please schedule.

## 2016-07-12 NOTE — Telephone Encounter (Signed)
Pt aware paperwork has been faxed He schedule appointment 10/09/15 Copy for file  Copy for scan Copy for pt

## 2016-09-16 ENCOUNTER — Encounter: Payer: Self-pay | Admitting: Primary Care

## 2016-09-16 ENCOUNTER — Ambulatory Visit (INDEPENDENT_AMBULATORY_CARE_PROVIDER_SITE_OTHER): Payer: 59 | Admitting: Primary Care

## 2016-09-16 VITALS — BP 122/84 | HR 72 | Temp 98.6°F | Ht 72.25 in | Wt 215.0 lb

## 2016-09-16 DIAGNOSIS — Z0000 Encounter for general adult medical examination without abnormal findings: Secondary | ICD-10-CM | POA: Diagnosis not present

## 2016-09-16 DIAGNOSIS — M25569 Pain in unspecified knee: Secondary | ICD-10-CM

## 2016-09-16 DIAGNOSIS — G8929 Other chronic pain: Secondary | ICD-10-CM

## 2016-09-16 DIAGNOSIS — Z0001 Encounter for general adult medical examination with abnormal findings: Secondary | ICD-10-CM | POA: Insufficient documentation

## 2016-09-16 LAB — COMPREHENSIVE METABOLIC PANEL
ALT: 28 U/L (ref 0–53)
AST: 36 U/L (ref 0–37)
Albumin: 4.3 g/dL (ref 3.5–5.2)
Alkaline Phosphatase: 46 U/L (ref 39–117)
BILIRUBIN TOTAL: 0.5 mg/dL (ref 0.2–1.2)
BUN: 12 mg/dL (ref 6–23)
CO2: 28 mEq/L (ref 19–32)
Calcium: 9.4 mg/dL (ref 8.4–10.5)
Chloride: 105 mEq/L (ref 96–112)
Creatinine, Ser: 1.15 mg/dL (ref 0.40–1.50)
GFR: 92.89 mL/min (ref >60.00)
Glucose, Bld: 100 mg/dL — ABNORMAL HIGH (ref 70–99)
Potassium: 3.8 mEq/L (ref 3.5–5.1)
Sodium: 139 mEq/L (ref 135–145)
Total Protein: 7.5 g/dL (ref 6.0–8.3)

## 2016-09-16 LAB — LIPID PANEL
CHOLESTEROL: 157 mg/dL (ref 0–200)
HDL: 56.5 mg/dL (ref >39.00)
LDL Cholesterol: 79 mg/dL (ref 0–99)
NonHDL: 100.89
Total CHOL/HDL Ratio: 3
Triglycerides: 108 mg/dL (ref 0.0–149.0)
VLDL: 21.6 mg/dL (ref 0.0–40.0)

## 2016-09-16 NOTE — Progress Notes (Signed)
Subjective:    Patient ID: Francis Herman, male    DOB: 01/18/1981, 36 y.o.   MRN: [id number]  HPI  Mr. Francis Herman is a [age] year old male who presents today for complete physical.  Immunizations: -Tetanus: Completed in 2016 -Influenza: Completed in Fall 2017   Diet: He endorses a healthy diet. Breakfast: Yogurt Lunch: Sandwich, protein Dinner: Vegetables, protein, starch Snacks: None Desserts: None Beverages: Water, occasional coffee  Exercise: Works out daily at [employer] gym. Eye exam: Completed in 2017 Dental exam: Completes semi-annually   Review of Systems  Constitutional: Negative for unexpected weight change.  HENT: Negative for rhinorrhea.   Respiratory: Negative for cough and shortness of breath.   Cardiovascular: Negative for chest pain.  Gastrointestinal: Negative for constipation and diarrhea.  Genitourinary: Negative for difficulty urinating.  Musculoskeletal: Positive for arthralgias. Negative for myalgias.       Chronic knee pain, improved overall. He was using FMLA 4 times monthly on average.  Skin: Negative for rash.  Allergic/Immunologic: Negative for environmental allergies.  Neurological: Negative for dizziness, numbness and headaches.  Psychiatric/Behavioral:       Working with his pastor to help through the divorce. He is working on getting connected to meet new people.       Past Medical History:  Diagnosis Date  . Acute meniscal tear of right knee   . Alcohol abuse   . Arthritis    R OA knee  . High cholesterol   . Wears glasses      Social History   Social History  . Marital status: Married    Spouse name: N/A  . Number of children: N/A  . Years of education: N/A   Occupational History  . Not on file.   Social History Main Topics  . Smoking status: Never Smoker  . Smokeless tobacco: Never Used  . Alcohol use No     Comment: socially  . Drug use: No  . Sexual activity: Not on file   Other Topics Concern  . Not on file   Social  History Narrative   ** Merged History Encounter **       Divorced; from [place]; [place] since 2000      Children: 2 children      Employment: Works for [employer].       Tobacco: none      Exercise: biking   Enjoys spending time with his children, watching sports.           Past Surgical History:  Procedure Laterality Date  . KNEE ARTHROSCOPY WITH MEDIAL MENISECTOMY Right 06/05/2013   Procedure:  Right Knee Arthroscopy with Medial Meniscectomy Chondroplasty and Micro Fracture;  Surgeon: Eugenia Mcalpineobert Collins, MD;  [facility];  Service: Orthopedics;  Laterality: Right;  . KNEE ARTHROSCOPY WITH MENISCAL REPAIR Left 2009    Family History  Problem Relation Age of Onset  . Arthritis Maternal Grandmother   . Hypertension Maternal Grandfather   . Parkinsonism Father   . Hyperlipidemia Father   . Syncope episode Father     No Known Allergies  No current outpatient prescriptions on file prior to visit.   No current facility-administered medications on file prior to visit.     BP 122/84   Pulse 72   Temp 98.6 F (37 C) (Oral)   Ht 6' 0.25" (1.835 m)   Wt 215 lb (97.5 kg)   SpO2 97%   BMI 28.96 kg/m    Objective:   Physical Exam  Constitutional: He is oriented to person, place, and time. He appears well-nourished.  HENT:  Right Ear: Tympanic membrane and ear canal normal.  Left Ear: Tympanic membrane and ear canal normal.  Nose: Nose normal. Right sinus exhibits no maxillary sinus tenderness and no frontal sinus tenderness. Left sinus exhibits no maxillary sinus tenderness and no frontal sinus tenderness.  Mouth/Throat: Oropharynx is clear and moist.  Eyes: Conjunctivae and EOM are normal. Pupils are equal, round, and reactive to light.  Neck: Neck supple. Carotid bruit is not present. No thyromegaly present.  Cardiovascular: Normal rate, regular rhythm and normal heart sounds.   Pulmonary/Chest: Effort normal and breath sounds normal. He has no  wheezes. He has no rales.  Abdominal: Soft. Bowel sounds are normal. There is no tenderness.  Musculoskeletal: Normal range of motion.  Neurological: He is alert and oriented to person, place, and time. He has normal reflexes. No cranial nerve deficit.  Skin: Skin is warm and dry.  Psychiatric: He has a normal mood and affect.          Assessment & Plan:

## 2016-09-16 NOTE — Patient Instructions (Signed)
Complete lab work prior to leaving today. I will notify you of your results once received.   Continue your efforts towards a healthy lifestyle through diet and exercise.  Continue to limit alcohol as discussed.  Follow up in 1 year for your annual physical or sooner if needed.  It was a pleasure to see you today!

## 2016-09-16 NOTE — Assessment & Plan Note (Signed)
Immunizations UTD. Commended him on his healthy lifestyle through diet and exercise. Exam today unremarkable. Labs pending. Follow up in 1 year for annual exam.

## 2016-09-16 NOTE — Assessment & Plan Note (Signed)
Pain improvement with reduction in alcohol and increase in exercise. Encouraged he continue. Will monitor.

## 2016-09-16 NOTE — Progress Notes (Signed)
Pre visit review using our clinic review tool, if applicable. No additional management support is needed unless otherwise documented below in the visit note. 

## 2016-09-21 ENCOUNTER — Encounter: Payer: Self-pay | Admitting: *Deleted

## 2016-09-21 ENCOUNTER — Telehealth: Payer: Self-pay | Admitting: Primary Care

## 2016-09-21 NOTE — Telephone Encounter (Signed)
Testosterone is not routinely checked during a physical exam. It's checked for specific symptoms including erectile dysfunction, extreme fatigue, history of hypogonadism, none of which he mentioned to me.

## 2016-09-21 NOTE — Telephone Encounter (Signed)
Patient returned Chan's call and received his lab results.  Patient wants to know if his testosterone should be checked.

## 2016-09-24 NOTE — Telephone Encounter (Signed)
Noted  

## 2016-09-24 NOTE — Telephone Encounter (Signed)
Spoken and notified patient of Kate's comments. Patient verbalized understanding.  Patient stated that he thought testosterone tested yearly but patient does not have any of those symptoms. If these every comes up. Patient will let us know.

## 2016-10-08 ENCOUNTER — Encounter: Payer: Self-pay | Admitting: Primary Care

## 2017-01-05 ENCOUNTER — Encounter: Payer: Self-pay | Admitting: Primary Care

## 2017-01-05 ENCOUNTER — Ambulatory Visit (INDEPENDENT_AMBULATORY_CARE_PROVIDER_SITE_OTHER)
Admission: RE | Admit: 2017-01-05 | Discharge: 2017-01-05 | Disposition: A | Discharge status: Home / Self Care | Payer: BC Managed Care – PPO | Source: Ambulatory Visit | Attending: Primary Care | Admitting: Primary Care

## 2017-01-05 ENCOUNTER — Ambulatory Visit
Admission: RE | Admit: 2017-01-05 | Discharge: 2017-01-05 | Disposition: A | Discharge status: Home / Self Care | Payer: BC Managed Care – PPO | Source: Ambulatory Visit | Attending: Primary Care | Admitting: Primary Care

## 2017-01-05 ENCOUNTER — Ambulatory Visit (INDEPENDENT_AMBULATORY_CARE_PROVIDER_SITE_OTHER): Payer: BC Managed Care – PPO | Admitting: Primary Care

## 2017-01-05 VITALS — BP 122/74 | HR 86 | Temp 98.6°F | Wt 208.8 lb

## 2017-01-05 DIAGNOSIS — M25561 Pain in right knee: Secondary | ICD-10-CM | POA: Diagnosis not present

## 2017-01-05 DIAGNOSIS — G8929 Other chronic pain: Secondary | ICD-10-CM

## 2017-01-05 DIAGNOSIS — M25562 Pain in left knee: Secondary | ICD-10-CM | POA: Diagnosis not present

## 2017-01-05 NOTE — Patient Instructions (Signed)
Complete xray(s) prior to leaving today. I will notify you of your results once received.  Continue regular exercise in order to maintain a healthy weight to reduce overall pressure to knees.   Follow up in 6 months for re-evaluation.   It was a pleasure to see you today!

## 2017-01-05 NOTE — Progress Notes (Signed)
Subjective:    Patient ID: Francis Herman, male    DOB: 02/07/1981, 36 y.o.   MRN: [id number]  HPI  Francis Herman is a 36 year old male who presents today for follow up.  1) Chronic Knee Pain: Bilateral with history of surgical intervention to bilateral knees in 2009 (left) 2014 (right). He is currently on intermittent FMLA for chronic knee pain.   Since his last visit he's been running three to four days weekly for 20 minutes. He denies numbness/tingling, swelling, pain, injury. 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 He's called out for FMLA once since his last visit with us. He's refrained from consumption of liquor which has helped to reduce pain. He's not having to take anything OTC for pain. Overall he's doing well.  Review of Systems  Constitutional: Negative for fever.  Musculoskeletal:       Chronic knee pain, well controlled  Skin: Negative for color change.       Past Medical History:  Diagnosis Date  . Acute meniscal tear of right knee   . Alcohol abuse   . Arthritis    R OA knee  . High cholesterol   . Wears glasses      Social History   Social History  . Marital status: Married    Spouse name: N/A  . Number of children: N/A  . Years of education: N/A   Occupational History  . Not on file.   Social History Main Topics  . Smoking status: Never Smoker  . Smokeless tobacco: Never Used  . Alcohol use No     Comment: socially  . Drug use: No  . Sexual activity: Not on file   Other Topics Concern  . Not on file   Social History Narrative   ** Merged History Encounter **       Divorced; from [place]; [place] since 2000      Children: 2 children      Employment: Works for [employer].       Tobacco: none      Exercise: biking   Enjoys spending time with his children, watching sports.           Past Surgical History:  Procedure Laterality Date  . KNEE ARTHROSCOPY WITH MEDIAL MENISECTOMY Right  06/05/2013   Procedure:  Right Knee Arthroscopy with Medial Meniscectomy Chondroplasty and Micro Fracture;  Surgeon: Eugenia Mcalpineobert Collins, MD;  [facility] [place];  Service: Orthopedics;  Laterality: Right;  . KNEE ARTHROSCOPY WITH MENISCAL REPAIR Left 2009    Family History  Problem Relation Age of Onset  . Arthritis Maternal Grandmother   . Hypertension Maternal Grandfather   . Parkinsonism Father   . Hyperlipidemia Father   . Syncope episode Father     No Known Allergies  No current outpatient prescriptions on file prior to visit.   No current facility-administered medications on file prior to visit.     BP 122/74 (BP Location: Left Arm, Patient Position: Sitting, Cuff Size: Normal)   Pulse 86   Temp 98.6 F (37 C) (Oral)   Wt 208 lb 12 oz (94.7 kg)   SpO2 95%   BMI 28.12 kg/m    Objective:   Physical Exam  Constitutional: He appears well-nourished.  Neck: Neck supple.  Cardiovascular: Normal rate and regular rhythm.   Pulmonary/Chest: Effort normal and breath sounds normal.  Musculoskeletal:       Right knee: He  exhibits normal range of motion and no swelling. No tenderness found.       Left knee: He exhibits normal range of motion and no swelling. No tenderness found.  Skin: Skin is warm and dry.          Assessment & Plan:

## 2017-01-05 NOTE — Assessment & Plan Note (Signed)
Doing very well, very limited use of FMLA for pain. Commended him on exercise and discussed to continue to limit alcohol. Follow up in 6 months. Check xrays of knees today.

## 2017-01-07 ENCOUNTER — Telehealth: Payer: Self-pay | Admitting: Primary Care

## 2017-01-07 NOTE — Telephone Encounter (Signed)
Patient is asking for his x-ray results to be released in My chart.  He said he needs it done today.

## 2017-01-07 NOTE — Telephone Encounter (Signed)
Pt stated he activated his my chart account and wants xray result sent there

## 2017-01-07 NOTE — Telephone Encounter (Signed)
Please notify patient that I do not see that he has activated my chart. He will need to activate my chart before we can release results.

## 2017-01-07 NOTE — Telephone Encounter (Signed)
Placed Kate's comments for patient to view on MyChart.

## 2017-01-07 NOTE — Telephone Encounter (Signed)
Spoken to patient. Notified patient that he have not activated his MyChart. Patient stated that he has MyChart and I asked which one? Patient stated that [facility]. I notified patient that [facility] and [facility] are two separate system with two different MyChart. I have patient [facility] MyChart help desk numberto see if they can help him.

## 2017-01-28 ENCOUNTER — Encounter: Payer: Self-pay | Admitting: Emergency Medicine

## 2017-01-28 ENCOUNTER — Emergency Department
Admission: EM | Admit: 2017-01-28 | Discharge: 2017-01-28 | Disposition: A | Discharge status: Home / Self Care | Payer: BC Managed Care – PPO | Attending: Emergency Medicine | Admitting: Emergency Medicine

## 2017-01-28 DIAGNOSIS — Y92410 Unspecified street and highway as the place of occurrence of the external cause: Secondary | ICD-10-CM | POA: Insufficient documentation

## 2017-01-28 DIAGNOSIS — Y998 Other external cause status: Secondary | ICD-10-CM | POA: Insufficient documentation

## 2017-01-28 DIAGNOSIS — Y9389 Activity, other specified: Secondary | ICD-10-CM | POA: Diagnosis not present

## 2017-01-28 DIAGNOSIS — S199XXA Unspecified injury of neck, initial encounter: Secondary | ICD-10-CM | POA: Insufficient documentation
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
## 2017-01-28 NOTE — ED Notes (Signed)
Pt calling out wanting an update on how much longer.  Patient updated that provider is in doing a procedure in another room and should be in shortly.  Denies other needs at this time.

## 2017-01-28 NOTE — ED Notes (Signed)
Pt left AMA °

## 2017-01-28 NOTE — ED Notes (Signed)
Patient left as soon as the EDP left the room. Patient refused to sign or wait on papers.

## 2017-02-02 ENCOUNTER — Encounter: Payer: Self-pay | Admitting: Family Medicine

## 2017-02-02 ENCOUNTER — Ambulatory Visit (INDEPENDENT_AMBULATORY_CARE_PROVIDER_SITE_OTHER): Payer: Self-pay | Admitting: Family Medicine

## 2017-02-02 ENCOUNTER — Ambulatory Visit (INDEPENDENT_AMBULATORY_CARE_PROVIDER_SITE_OTHER)
Admission: RE | Admit: 2017-02-02 | Discharge: 2017-02-02 | Disposition: A | Discharge status: Home / Self Care | Payer: BC Managed Care – PPO | Source: Ambulatory Visit | Attending: Family Medicine | Admitting: Family Medicine

## 2017-02-02 DIAGNOSIS — S161XXA Strain of muscle, fascia and tendon at neck level, initial encounter: Secondary | ICD-10-CM

## 2017-02-02 MED ORDER — CYCLOBENZAPRINE HCL 5 MG PO TABS: 5.0000 mg | ORAL_TABLET | Freq: Three times a day (TID) | ORAL | 1 refills | Status: DC | PRN
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
## 2017-03-28 ENCOUNTER — Telehealth: Payer: PRIVATE HEALTH INSURANCE | Admitting: Family

## 2017-03-28 DIAGNOSIS — T7840XA Allergy, unspecified, initial encounter: Secondary | ICD-10-CM

## 2017-03-28 DIAGNOSIS — R21 Rash and other nonspecific skin eruption: Secondary | ICD-10-CM

## 2017-03-28 NOTE — Progress Notes (Signed)
E Visit for Rash  We are sorry that you are not feeling well. Here is how we plan to help!   Based on what you shared with me you may have  an allergic reaction.  I recommend you take Benadryl 25 mg - 50 mg every 4 hours to control the symptoms but if they last over 24 hours it is best that you see an office based provider for follow up.    HOME CARE:   Take cool showers and avoid direct sunlight.  Apply cool compress or wet dressings.  Take a bath in an oatmeal bath.  Sprinkle content of one Aveeno packet under running faucet with comfortably warm water.  Bathe for 15-20 minutes, 1-2 times daily.  Pat dry with a towel. Do not rub the rash.  Use hydrocortisone cream.  Take an antihistamine like Benadryl for widespread rashes that itch.  The adult dose of Benadryl is 25-50 mg by mouth 4 times daily.  Caution:  This type of medication may cause sleepiness.  Do not drink alcohol, drive, or operate dangerous machinery while taking antihistamines.  Do not take these medications if you have prostate enlargement.  Read package instructions thoroughly on all medications that you take.  GET HELP RIGHT AWAY IF:   Symptoms don't go away after treatment.  Severe itching that persists.  If you rash spreads or swells.  If you rash begins to smell.  If it blisters and opens or develops a yellow-brown crust.  You develop a fever.  You have a sore throat.  You become short of breath.  MAKE SURE YOU:  Understand these instructions. Will watch your condition. Will get help right away if you are not doing well or get worse.  Thank you for choosing an e-visit. Your e-visit answers were reviewed by a board certified advanced clinical practitioner to complete your personal care plan. Depending upon the condition, your plan could have included both over the counter or prescription medications. Please review your pharmacy choice. Be sure that the pharmacy you have chosen is open so that you  can pick up your prescription now.  If there is a problem you may message your provider in MyChart to have the prescription routed to another pharmacy. Your safety is important to [place]. If you have drug allergies check your prescription carefully.  For the next 24 hours, you can use MyChart to ask questions about today's visit, request a non-urgent call back, or ask for a work or school excuse from your e-visit provider. You will get an email in the next two days asking about your experience. I hope that your e-visit has been valuable and will speed your recovery.

## 2017-04-26 ENCOUNTER — Telehealth: Payer: Self-pay | Admitting: Primary Care
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
## 2017-04-26 NOTE — Telephone Encounter (Signed)
Spoke to the pt, notified him ok to pick up disc

## 2017-04-26 NOTE — Telephone Encounter (Signed)
done

## 2017-04-26 NOTE — Telephone Encounter (Signed)
Pt left v/m requesting cb when disc is ready for pick up.  I tried to call pt but v/m is full.

## 2017-09-06 ENCOUNTER — Ambulatory Visit: Payer: Self-pay | Admitting: Primary Care

## 2017-09-06 ENCOUNTER — Other Ambulatory Visit: Payer: Self-pay | Admitting: Primary Care

## 2017-09-06 ENCOUNTER — Telehealth: Payer: Self-pay | Admitting: Primary Care

## 2017-09-06 DIAGNOSIS — Z0000 Encounter for general adult medical examination without abnormal findings: Secondary | ICD-10-CM

## 2017-09-06 NOTE — Telephone Encounter (Signed)
Pt dropped off FMLA paperwork  Pt has appointment 2/1 [place]work [place]

## 2017-09-06 NOTE — Telephone Encounter (Signed)
Noted, will discuss at upcoming visit.

## 2017-09-09 ENCOUNTER — Ambulatory Visit: Payer: Self-pay | Admitting: Primary Care

## 2017-09-12 ENCOUNTER — Other Ambulatory Visit: Payer: Self-pay

## 2017-09-13 ENCOUNTER — Other Ambulatory Visit: Payer: BC Managed Care – PPO

## 2017-09-13 ENCOUNTER — Ambulatory Visit: Payer: BC Managed Care – PPO | Admitting: Primary Care

## 2017-09-13 ENCOUNTER — Encounter: Payer: Self-pay | Admitting: Primary Care

## 2017-09-13 DIAGNOSIS — M25569 Pain in unspecified knee: Secondary | ICD-10-CM

## 2017-09-13 DIAGNOSIS — G8929 Other chronic pain: Secondary | ICD-10-CM

## 2017-09-13 NOTE — Assessment & Plan Note (Addendum)
Overall doing well, has not used FMLA since last visit. Commended him on regular exercise, encouraged to continue. Recommended he limit alcohol use as this has historically caused pain. FMLA paperwork updated and signed.

## 2017-09-13 NOTE — Progress Notes (Signed)
Subjective:    Patient ID: Francis Herman, male    DOB: 01/31/1981, 37 y.o.   MRN: [id number]  HPI  Francis Herman is a 37 year old male who presents today for follow up and FMLA paperwork.   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 History of surgical intervention to bilateral knees at various times, 2009 for left knee, 2014 to right knee. He is currently on intermittent FMLA for chronic knee pain secondary to osteoarthritis.   During his last visit he endorsed regular exercise and increased mobility and ROM to knees. He was using FMLA infrequently and wasn't taking anything OTC for symptoms.  Since his last visit he's doing well except recently. He recently tripped over an arm board that was laying on the floor. This caused his knee to twist outward. He's been taking Aleve and the pain hasn't bothered him, he did see an orthopedist as well. He endorses that he's not used his FMLA since his last visit. He continues to exercise by using the elliptical and cycling 2-3 times weekly. He has started drinking alcohol again, only on the weekends.  Review of Systems  Musculoskeletal:       Chronic bilateral knee pain, no pain on a daily basis.  Skin: Negative for color change.       Past Medical History:  Diagnosis Date  . Acute meniscal tear of right knee   . Alcohol abuse   . Arthritis    R OA knee  . High cholesterol   . Wears glasses      Social History   Socioeconomic History  . Marital status: Married    Spouse name: Not on file  . Number of children: Not on file  . Years of education: Not on file  . Highest education level: Not on file  Social Needs  . Financial resource strain: Not on file  . Food insecurity - worry: Not on file  . Food insecurity - inability: Not on file  . Transportation needs - medical: Not on file  . Transportation needs - non-medical: Not on file  Occupational History  . Not on file  Tobacco Use  . Smoking status:  Never Smoker  . Smokeless tobacco: Never Used  Substance and Sexual Activity  . Alcohol use: No    Alcohol/week: 1.5 oz    Types: 3 Standard drinks or equivalent per week    Comment: socially  . Drug use: No  . Sexual activity: Not on file  Other Topics Concern  . Not on file  Social History Narrative   ** Merged History Encounter **       Divorced; from [place]; [place] since 2000      Children: 2 children      Employment: Works for [employer].       Tobacco: none      Exercise: biking   Enjoys spending time with his children, watching sports.        Past Surgical History:  Procedure Laterality Date  . KNEE ARTHROSCOPY WITH MEDIAL MENISECTOMY Right 06/05/2013   Procedure:  Right Knee Arthroscopy with Medial Meniscectomy Chondroplasty and Micro Fracture;  Surgeon: Eugenia Mcalpineobert Collins, MD;  [facility];  Service: Orthopedics;  Laterality: Right;  . KNEE ARTHROSCOPY WITH MENISCAL REPAIR Left 2009    Family History  Problem Relation Age of Onset  . Arthritis Maternal Grandmother   . Hypertension Maternal Grandfather   . Parkinsonism Father   .  Hyperlipidemia Father   . Syncope episode Father     No Known Allergies  No current outpatient medications on file prior to visit.   No current facility-administered medications on file prior to visit.     BP 122/84   Pulse 66   Temp 98.2 F (36.8 C) (Oral)   Ht 6' 0.25" (1.835 m)   Wt 221 lb (100.2 kg)   SpO2 96%   BMI 29.77 kg/m    Objective:   Physical Exam  Constitutional: He is oriented to person, place, and time. He appears well-nourished.  Neck: Neck supple.  Cardiovascular: Normal rate and regular rhythm.  Pulmonary/Chest: Effort normal and breath sounds normal. He has no wheezes. He has no rales.  Musculoskeletal: Normal range of motion.       Right knee: He exhibits normal range of motion.       Left knee: He exhibits normal range of motion.  5/5/ strength to bilateral lower  extremities.  Neurological: He is alert and oriented to person, place, and time.  Reflex Scores:      Patellar reflexes are 2+ on the right side and 2+ on the left side. Skin: Skin is warm and dry.          Assessment & Plan:

## 2017-09-13 NOTE — Patient Instructions (Signed)
Continue exercising. You should be getting 150 minutes of exercise weekly.  Cancel the lab appointment.  We'll see you in a few weeks.  It was a pleasure to see you today!

## 2017-09-19 ENCOUNTER — Encounter: Payer: Self-pay | Admitting: Primary Care

## 2017-09-19 ENCOUNTER — Ambulatory Visit (INDEPENDENT_AMBULATORY_CARE_PROVIDER_SITE_OTHER): Payer: BC Managed Care – PPO | Admitting: Primary Care

## 2017-09-19 VITALS — BP 120/76 | HR 75 | Temp 98.4°F | Ht 72.25 in | Wt 217.8 lb

## 2017-09-19 DIAGNOSIS — Z0000 Encounter for general adult medical examination without abnormal findings: Secondary | ICD-10-CM

## 2017-09-19 LAB — LIPID PANEL
Cholesterol: 171 mg/dL (ref 0–200)
HDL: 63.5 mg/dL (ref >39.00)
LDL CALC: 87 mg/dL (ref 0–99)
NonHDL: 107.58
Total CHOL/HDL Ratio: 3
Triglycerides: 103 mg/dL (ref 0.0–149.0)
VLDL: 20.6 mg/dL (ref 0.0–40.0)

## 2017-09-19 LAB — COMPREHENSIVE METABOLIC PANEL
ALT: 17 U/L (ref 0–53)
AST: 18 U/L (ref 0–37)
Albumin: 4.1 g/dL (ref 3.5–5.2)
Alkaline Phosphatase: 46 U/L (ref 39–117)
BUN: 17 mg/dL (ref 6–23)
CHLORIDE: 103 meq/L (ref 96–112)
CO2: 27 mEq/L (ref 19–32)
Calcium: 9.4 mg/dL (ref 8.4–10.5)
Creatinine, Ser: 1.15 mg/dL (ref 0.40–1.50)
GFR: 92.36 mL/min (ref >60.00)
Glucose, Bld: 105 mg/dL — ABNORMAL HIGH (ref 70–99)
POTASSIUM: 4.1 meq/L (ref 3.5–5.1)
Sodium: 140 mEq/L (ref 135–145)
Total Bilirubin: 0.8 mg/dL (ref 0.2–1.2)
Total Protein: 7.5 g/dL (ref 6.0–8.3)

## 2017-09-19 NOTE — Progress Notes (Signed)
Subjective:    Patient ID: Francis Herman, male    DOB: [birth year], 37 y.o.   MRN: [id number]  HPI  Francis Herman is a 37 year old male who presents today for complete physical.  Immunizations: -Tetanus: Completed in 2016 -Influenza: Completed this season   Diet: He endorses a fair diet Breakfast: Skips, sometimes eggs and potatoes  Lunch: Cornmeal or rice with beef/chicken, vegetables Dinner: Cornmeal or rice with beef/chicken, vegetables  Snacks: None Desserts: None Beverages: Water, alcohol 2-3 days weekly (has been cutting back)  Exercise: He is exercising 2-3 days weekly at the gym Eye exam: Completed in 2018 Dental exam: Completes semi-annually    Review of Systems  Constitutional: Negative for unexpected weight change.  HENT: Negative for rhinorrhea.   Respiratory: Negative for cough and shortness of breath.   Cardiovascular: Negative for chest pain.  Gastrointestinal: Negative for constipation and diarrhea.  Genitourinary: Negative for difficulty urinating.  Musculoskeletal: Positive for arthralgias. Negative for myalgias.  Skin: Negative for rash.  Allergic/Immunologic: Negative for environmental allergies.  Neurological: Negative for dizziness, numbness and headaches.  Psychiatric/Behavioral:       Some increased stress and some uncertainty about his future.        Past Medical History:  Diagnosis Date  . Acute meniscal tear of right knee   . Alcohol abuse   . Arthritis    R OA knee  . High cholesterol   . Wears glasses      Social History   Socioeconomic History  . Marital status: Married    Spouse name: Not on file  . Number of children: Not on file  . Years of education: Not on file  . Highest education level: Not on file  Social Needs  . Financial resource strain: Not on file  . Food insecurity - worry: Not on file  . Food insecurity - inability: Not on file  . Transportation needs - medical: Not on file  . Transportation needs - non-medical:  Not on file  Occupational History  . Not on file  Tobacco Use  . Smoking status: Never Smoker  . Smokeless tobacco: Never Used  Substance and Sexual Activity  . Alcohol use: No    Alcohol/week: 1.5 oz    Types: 3 Standard drinks or equivalent per week    Comment: socially  . Drug use: No  . Sexual activity: Not on file  Other Topics Concern  . Not on file  Social History Narrative   ** Merged History Encounter **       Divorced; from [place]; [place] since 2000      Children: 2 children      Employment: Works for [employer].       Tobacco: none      Exercise: biking   Enjoys spending time with his children, watching sports.        Past Surgical History:  Procedure Laterality Date  . KNEE ARTHROSCOPY WITH MEDIAL MENISECTOMY Right 06/05/2013   Procedure:  Right Knee Arthroscopy with Medial Meniscectomy Chondroplasty and Micro Fracture;  Surgeon: Eugenia Mcalpine, MD;  Location: [facility];  Service: Orthopedics;  Laterality: Right;  . KNEE ARTHROSCOPY WITH MENISCAL REPAIR Left 2009    Family History  Problem Relation Age of Onset  . Arthritis Maternal Grandmother   . Hypertension Maternal Grandfather   . Parkinsonism Father   . Hyperlipidemia Father   . Syncope episode Father     No Known Allergies  No current outpatient  medications on file prior to visit.   No current facility-administered medications on file prior to visit.     BP 120/76   Pulse 75   Temp 98.4 F (36.9 C) (Oral)   Ht 6' 0.25" (1.835 m)   Wt 217 lb 12 oz (98.8 kg)   SpO2 97%   BMI 29.33 kg/m    Objective:   Physical Exam  Constitutional: He is oriented to person, place, and time. He appears well-nourished.  HENT:  Right Ear: Tympanic membrane and ear canal normal.  Left Ear: Tympanic membrane and ear canal normal.  Nose: Nose normal. Right sinus exhibits no maxillary sinus tenderness and no frontal sinus tenderness. Left sinus exhibits no maxillary sinus tenderness and  no frontal sinus tenderness.  Mouth/Throat: Oropharynx is clear and moist.  Eyes: Conjunctivae and EOM are normal. Pupils are equal, round, and reactive to light.  Neck: Neck supple. Carotid bruit is not present. No thyromegaly present.  Cardiovascular: Normal rate, regular rhythm and normal heart sounds.  Pulmonary/Chest: Effort normal and breath sounds normal. He has no wheezes. He has no rales.  Abdominal: Soft. Bowel sounds are normal. There is no tenderness.  Musculoskeletal: Normal range of motion.  Neurological: He is alert and oriented to person, place, and time. He has normal reflexes. No cranial nerve deficit.  Skin: Skin is warm and dry.  Psychiatric: He has a normal mood and affect.          Assessment & Plan:

## 2017-09-19 NOTE — Assessment & Plan Note (Signed)
Immunizations UTD. Discussed the importance of a healthy diet and regular exercise in order for weight loss, and to reduce the risk of any potential medical problems. Exam unremarkable. Labs pending. Follow up in 1 year. 

## 2017-09-19 NOTE — Patient Instructions (Addendum)
Stop by the lab prior to leaving today. I will notify you of your results once received.   Continue exercising. You should be getting 150 minutes of moderate intensity exercise weekly.  Increase consumption of vegetables, fruit, whole grain, lean protein.  Follow up in 1 year for your annual exam or sooner if needed.  It was a pleasure to see you today!   Preventive Care 18-39 Years, Male Preventive care refers to lifestyle choices and visits with your health care provider that can promote health and wellness. What does preventive care include?  A yearly physical exam. This is also called an annual well check.  Dental exams once or twice a year.  Routine eye exams. Ask your health care provider how often you should have your eyes checked.  Personal lifestyle choices, including: ? Daily care of your teeth and gums. ? Regular physical activity. ? Eating a healthy diet. ? Avoiding tobacco and drug use. ? Limiting alcohol use. ? Practicing safe sex. What happens during an annual well check? The services and screenings done by your health care provider during your annual well check will depend on your age, overall health, lifestyle risk factors, and family history of disease. Counseling Your health care provider may ask you questions about your:  Alcohol use.  Tobacco use.  Drug use.  Emotional well-being.  Home and relationship well-being.  Sexual activity.  Eating habits.  Work and work Statistician.  Screening You may have the following tests or measurements:  Height, weight, and BMI.  Blood pressure.  Lipid and cholesterol levels. These may be checked every 5 years starting at age 60.  Diabetes screening. This is done by checking your blood sugar (glucose) after you have not eaten for a while (fasting).  Skin check.  Hepatitis C blood test.  Hepatitis B blood test.  Sexually transmitted disease (STD) testing.  Discuss your test results, treatment  options, and if necessary, the need for more tests with your health care provider. Vaccines Your health care provider may recommend certain vaccines, such as:  Influenza vaccine. This is recommended every year.  Tetanus, diphtheria, and acellular pertussis (Tdap, Td) vaccine. You may need a Td booster every 10 years.  Varicella vaccine. You may need this if you have not been vaccinated.  HPV vaccine. If you are 16 or younger, you may need three doses over 6 months.  Measles, mumps, and rubella (MMR) vaccine. You may need at least one dose of MMR.You may also need a second dose.  Pneumococcal 13-valent conjugate (PCV13) vaccine. You may need this if you have certain conditions and have not been vaccinated.  Pneumococcal polysaccharide (PPSV23) vaccine. You may need one or two doses if you smoke cigarettes or if you have certain conditions.  Meningococcal vaccine. One dose is recommended if you are age 32-21 years and a first-year college student living in a residence hall, or if you have one of several medical conditions. You may also need additional booster doses.  Hepatitis A vaccine. You may need this if you have certain conditions or if you travel or work in places where you may be exposed to hepatitis A.  Hepatitis B vaccine. You may need this if you have certain conditions or if you travel or work in places where you may be exposed to hepatitis B.  Haemophilus influenzae type b (Hib) vaccine. You may need this if you have certain risk factors.  Talk to your health care provider about which screenings and vaccines you need  and how often you need them. This information is not intended to replace advice given to you by your health care provider. Make sure you discuss any questions you have with your health care provider. Document Released: 09/21/2001 Document Revised: 04/14/2016 Document Reviewed: 05/27/2015 Elsevier Interactive Patient Education  [employer].

## 2017-09-20 ENCOUNTER — Other Ambulatory Visit: Payer: BC Managed Care – PPO

## 2017-09-21 ENCOUNTER — Other Ambulatory Visit (INDEPENDENT_AMBULATORY_CARE_PROVIDER_SITE_OTHER): Payer: BC Managed Care – PPO

## 2017-09-21 DIAGNOSIS — Z0000 Encounter for general adult medical examination without abnormal findings: Secondary | ICD-10-CM

## 2017-09-21 LAB — HEMOGLOBIN A1C: Hgb A1c MFr Bld: 5.5 % (ref 4.6–6.5)

## 2017-12-27 ENCOUNTER — Telehealth: Payer: Self-pay | Admitting: Primary Care

## 2017-12-27 NOTE — Telephone Encounter (Signed)
[place] faxed fmla paperwork  Pt would like to have 3 days instead of 2  In [employer] in box

## 2017-12-28 NOTE — Telephone Encounter (Signed)
Paperwork completed and placed on Robin's desk. 

## 2017-12-29 NOTE — Telephone Encounter (Signed)
Paperwork faxed °Copy for pt °Copy for scan °

## 2017-12-29 NOTE — Telephone Encounter (Signed)
Pt aware.

## 2018-02-27 ENCOUNTER — Telehealth: Payer: Self-pay | Admitting: Primary Care

## 2018-02-27 NOTE — Telephone Encounter (Signed)
That's quite an increase in requested days off. He will need an office visit to discuss before I can approve.
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
## 2018-02-28 NOTE — Telephone Encounter (Signed)
Appointment 7/30  Pt aware

## 2018-03-07 ENCOUNTER — Ambulatory Visit (INDEPENDENT_AMBULATORY_CARE_PROVIDER_SITE_OTHER): Payer: BC Managed Care – PPO | Admitting: Primary Care

## 2018-03-07 ENCOUNTER — Encounter: Payer: Self-pay | Admitting: Primary Care

## 2018-03-07 DIAGNOSIS — M25569 Pain in unspecified knee: Secondary | ICD-10-CM | POA: Diagnosis not present

## 2018-03-07 DIAGNOSIS — G8929 Other chronic pain: Secondary | ICD-10-CM | POA: Diagnosis not present
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
## 2018-03-07 NOTE — Patient Instructions (Signed)
I will work on Pharmacologistyour FMLA paper and get this faxed today.  Schedule a follow up visit in 3 months for re-evaluation.  It was a pleasure to see you today!
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
## 2018-03-09 NOTE — Telephone Encounter (Signed)
Pt aware paperwork has been faxed Per pt request copied mailed to him Copy for scan

## 2018-04-21 ENCOUNTER — Telehealth: Payer: Self-pay | Admitting: *Deleted

## 2018-04-21 NOTE — Telephone Encounter (Signed)
I don't get alerted to when he goes for appointments but I reviewed his last office visit from late August. I see that they do not recommend surgery and that the plan is for physical therapy and steroid injections. Does he have a specific question? He's more than welcome to communicate with me via my chart.

## 2018-04-21 NOTE — Telephone Encounter (Signed)
Copied from CRM [phone]. Topic: General - Other >> Apr 20, 2018 12:55 PM Arlyss Gandyichardson, Taren N, NT wrote: Reason for CRM: Pt would like to see if Vernona RiegerKatherine Clark is being updated by the orthopedic when he goes to appts. He is requesting a callback from her.

## 2018-04-24 NOTE — Telephone Encounter (Signed)
Noted  

## 2018-04-24 NOTE — Telephone Encounter (Signed)
Spoken and notified patient of Graylon GunningKate Clark's comments. Patient just wanted to make sure that Mayra ReelKate Clark is aware. No additional questions.

## 2018-05-08 ENCOUNTER — Ambulatory Visit: Payer: BC Managed Care – PPO | Attending: Orthopedic Surgery | Admitting: Physical Therapy

## 2018-05-08 DIAGNOSIS — M25561 Pain in right knee: Secondary | ICD-10-CM | POA: Diagnosis not present
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
## 2018-05-10 ENCOUNTER — Encounter: Payer: Self-pay | Admitting: Physical Therapy

## 2018-05-10 ENCOUNTER — Ambulatory Visit: Payer: BC Managed Care – PPO | Attending: Orthopedic Surgery | Admitting: Physical Therapy

## 2018-05-10 DIAGNOSIS — M25561 Pain in right knee: Secondary | ICD-10-CM | POA: Diagnosis not present
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
## 2018-05-16 ENCOUNTER — Ambulatory Visit: Payer: BC Managed Care – PPO | Admitting: Physical Therapy

## 2018-05-16 ENCOUNTER — Encounter: Payer: Self-pay | Admitting: Physical Therapy

## 2018-05-16 DIAGNOSIS — M25561 Pain in right knee: Secondary | ICD-10-CM | POA: Diagnosis not present
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
## 2018-05-18 ENCOUNTER — Ambulatory Visit: Payer: BC Managed Care – PPO | Admitting: Physical Therapy

## 2018-05-23 ENCOUNTER — Encounter: Payer: Self-pay | Admitting: Physical Therapy

## 2018-05-23 ENCOUNTER — Ambulatory Visit: Payer: BC Managed Care – PPO | Admitting: Physical Therapy

## 2018-05-23 ENCOUNTER — Encounter: Payer: PRIVATE HEALTH INSURANCE | Admitting: Physical Therapy

## 2018-05-23 DIAGNOSIS — M25561 Pain in right knee: Secondary | ICD-10-CM
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
## 2018-05-25 ENCOUNTER — Ambulatory Visit: Payer: BC Managed Care – PPO | Admitting: Physical Therapy

## 2018-05-29 ENCOUNTER — Ambulatory Visit: Payer: BC Managed Care – PPO | Admitting: Physical Therapy

## 2018-05-31 ENCOUNTER — Ambulatory Visit: Payer: BC Managed Care – PPO

## 2018-05-31 DIAGNOSIS — M25561 Pain in right knee: Secondary | ICD-10-CM | POA: Diagnosis not present
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
## 2018-06-02 ENCOUNTER — Ambulatory Visit: Payer: BC Managed Care – PPO | Admitting: Physical Therapy

## 2018-06-06 ENCOUNTER — Ambulatory Visit: Payer: BC Managed Care – PPO | Admitting: Physical Therapy

## 2018-06-08 ENCOUNTER — Encounter: Payer: Self-pay | Admitting: Physical Therapy

## 2018-06-08 ENCOUNTER — Ambulatory Visit: Payer: BC Managed Care – PPO | Admitting: Physical Therapy

## 2018-06-08 DIAGNOSIS — M25561 Pain in right knee: Secondary | ICD-10-CM | POA: Diagnosis not present
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
## 2018-06-13 ENCOUNTER — Ambulatory Visit: Payer: PRIVATE HEALTH INSURANCE | Attending: Orthopedic Surgery | Admitting: Physical Therapy

## 2018-06-13 DIAGNOSIS — M25561 Pain in right knee: Secondary | ICD-10-CM | POA: Insufficient documentation

## 2018-06-14 ENCOUNTER — Encounter: Payer: Self-pay | Admitting: Physical Therapy

## 2018-06-14 ENCOUNTER — Ambulatory Visit: Payer: PRIVATE HEALTH INSURANCE | Admitting: Physical Therapy

## 2018-06-14 DIAGNOSIS — M25561 Pain in right knee: Secondary | ICD-10-CM
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
## 2018-06-15 ENCOUNTER — Encounter: Payer: Self-pay | Admitting: Physical Therapy

## 2018-06-20 ENCOUNTER — Encounter: Payer: PRIVATE HEALTH INSURANCE | Admitting: Physical Therapy

## 2018-06-21 ENCOUNTER — Encounter: Payer: Self-pay | Admitting: Physical Therapy

## 2018-06-21 ENCOUNTER — Ambulatory Visit: Payer: PRIVATE HEALTH INSURANCE | Admitting: Physical Therapy

## 2018-06-21 DIAGNOSIS — M25561 Pain in right knee: Secondary | ICD-10-CM
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
## 2018-06-22 ENCOUNTER — Encounter: Payer: PRIVATE HEALTH INSURANCE | Admitting: Physical Therapy

## 2018-06-27 ENCOUNTER — Encounter: Payer: PRIVATE HEALTH INSURANCE | Admitting: Physical Therapy

## 2018-06-29 ENCOUNTER — Encounter: Payer: Self-pay | Admitting: Physical Therapy

## 2018-06-30 ENCOUNTER — Ambulatory Visit: Payer: PRIVATE HEALTH INSURANCE | Admitting: Physical Therapy

## 2018-07-03 ENCOUNTER — Encounter: Payer: PRIVATE HEALTH INSURANCE | Admitting: Physical Therapy

## 2018-07-04 ENCOUNTER — Encounter: Payer: Self-pay | Admitting: Physical Therapy

## 2018-07-04 ENCOUNTER — Ambulatory Visit: Payer: PRIVATE HEALTH INSURANCE | Admitting: Physical Therapy

## 2018-07-04 DIAGNOSIS — M25561 Pain in right knee: Secondary | ICD-10-CM
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
## 2018-07-05 ENCOUNTER — Ambulatory Visit: Payer: PRIVATE HEALTH INSURANCE | Admitting: Physical Therapy
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
## 2019-06-29 NOTE — Telephone Encounter (Signed)
Francis Herman,  I am sorry but I am confused.

## 2020-03-26 ENCOUNTER — Ambulatory Visit (INDEPENDENT_AMBULATORY_CARE_PROVIDER_SITE_OTHER): Payer: Self-pay | Admitting: Primary Care

## 2020-03-26 ENCOUNTER — Other Ambulatory Visit: Payer: Self-pay

## 2020-03-26 VITALS — BP 150/100 | HR 95 | Temp 96.5°F | Ht 72.25 in | Wt 208.2 lb

## 2020-03-26 DIAGNOSIS — R030 Elevated blood-pressure reading, without diagnosis of hypertension: Secondary | ICD-10-CM | POA: Insufficient documentation

## 2020-03-26 DIAGNOSIS — G8929 Other chronic pain: Secondary | ICD-10-CM | POA: Insufficient documentation

## 2020-03-26 DIAGNOSIS — M25551 Pain in right hip: Secondary | ICD-10-CM

## 2020-03-26 NOTE — Assessment & Plan Note (Signed)
Above goal today, also during visit in 2019. He is in quite a bit of pain today. Recommended he start monitoring BP at home, report readings that are consistently at or above 135/90.

## 2020-03-26 NOTE — Progress Notes (Signed)
Subjective:    Patient ID: Francis Herman, male    DOB: 04-19-1981, 39 y.o.   MRN: [id number]  HPI  This visit occurred during the SARS-CoV-2 public health emergency.  Safety protocols were in place, including screening questions prior to the visit, additional usage of staff PPE, and extensive cleaning of exam room while observing appropriate contact time as indicated for disinfecting solutions.   Francis Herman is a 39 year old male with a history of chronic knee pain, right knee arthroscopy who presents today with a chief complaint of hip pain.  History of chronic right knee pain for years. Now with chronic right hip pain that has been bothersome over the last six months. Pain is constant but is worse with movement and activity. He cannot mow his entire lawn due to pain, it will take him 3-4 days to complete this activity. He will wake up with hip pain which actually keeps him up at night.   He's tried using crutches without much improvement. He is drinking alcohol and eating spicy food in order to relieve his pain, this helps temporarily. He's used several OTC topical and oral products without improvement. He is currently out of work.  He's undergone a steroid injection to the right hip about 6 months ago per orthopedics, it helped for about 3 months. He's undergone an xray of the hip about 6 months ago, was told "no fracture". He's uncertain if he had arthritis. He is requesting another referral to orthopedics. He cannot see his prior orthopedic doctor given lack of insurance.   He does not check his blood pressure at home. He denies dizziness, chest pain, headaches.   BP Readings from Last 3 Encounters:  03/26/20 (!) 150/100  03/07/18 140/86  09/19/17 120/76   Wt Readings from Last 3 Encounters:  03/26/20 208 lb 4 oz (94.5 kg)  03/07/18 233 lb (105.7 kg)  09/19/17 217 lb 12 oz (98.8 kg)     Review of Systems  Respiratory: Negative for shortness of breath.   Cardiovascular: Negative  for chest pain.  Musculoskeletal: Positive for arthralgias.       Chronic right hip pain  Neurological: Negative for dizziness and headaches.       Past Medical History:  Diagnosis Date  . Acute meniscal tear of right knee   . Alcohol abuse   . Arthritis    R OA knee  . High cholesterol   . Wears glasses      Social History   Socioeconomic History  . Marital status: Married    Spouse name: Not on file  . Number of children: Not on file  . Years of education: Not on file  . Highest education level: Not on file  Occupational History  . Not on file  Tobacco Use  . Smoking status: Never Smoker  . Smokeless tobacco: Never Used  Substance and Sexual Activity  . Alcohol use: No    Alcohol/week: 3.0 standard drinks    Types: 3 Standard drinks or equivalent per week    Comment: socially  . Drug use: No  . Sexual activity: Not on file  Other Topics Concern  . Not on file  Social History Narrative   ** Merged History Encounter **       Divorced; from [place]; [place] since 2000      Children: 2 children      Employment: Works for [employer].       Tobacco: none  Exercise: biking   Enjoys spending time with his children, watching sports.       Social Determinants of Health   Financial Resource Strain:   . Difficulty of Paying Living Expenses:   Food Insecurity:   . Worried About Programme researcher, broadcasting/film/video in the Last Year:   . Barista in the Last Year:   Transportation Needs:   . Freight forwarder (Medical):   Marland Kitchen Lack of Transportation (Non-Medical):   Physical Activity:   . Days of Exercise per Week:   . Minutes of Exercise per Session:   Stress:   . Feeling of Stress :   Social Connections:   . Frequency of Communication with Friends and Family:   . Frequency of Social Gatherings with Friends and Family:   . Attends Religious Services:   . Active Member of Clubs or Organizations:   . Attends Banker Meetings:   Marland Kitchen Marital Status:     Intimate Partner Violence:   . Fear of Current or Ex-Partner:   . Emotionally Abused:   Marland Kitchen Physically Abused:   . Sexually Abused:     Past Surgical History:  Procedure Laterality Date  . KNEE ARTHROSCOPY WITH MEDIAL MENISECTOMY Right 06/05/2013   Procedure:  Right Knee Arthroscopy with Medial Meniscectomy Chondroplasty and Micro Fracture;  Surgeon: Eugenia Mcalpine, MD;  Location: [facility] ;  Service: Orthopedics;  Laterality: Right;  . KNEE ARTHROSCOPY WITH MENISCAL REPAIR Left 2009    Family History  Problem Relation Age of Onset  . Arthritis Maternal Grandmother   . Hypertension Maternal Grandfather   . Parkinsonism Father   . Hyperlipidemia Father   . Syncope episode Father     No Known Allergies  No current outpatient medications on file prior to visit.   No current facility-administered medications on file prior to visit.    BP (!) 150/100   Pulse 95   Temp (!) 96.5 F (35.8 C) (Temporal)   Ht 6' 0.25" (1.835 m)   Wt 208 lb 4 oz (94.5 kg)   SpO2 98%   BMI 28.05 kg/m    Objective:   Physical Exam Cardiovascular:     Rate and Rhythm: Normal rate.  Pulmonary:     Effort: Pulmonary effort is normal.  Musculoskeletal:     Cervical back: Neck supple.     Right hip: Decreased range of motion.     Comments: Ambulatory with crutches, unable to externally or internally rotate right hip while supine due to pain. Decent hip flexion and extension.   Skin:    General: Skin is warm and dry.            Assessment & Plan:

## 2020-03-26 NOTE — Assessment & Plan Note (Signed)
Chronic for 6+ months, now seems quite debilitated due to pain. We do not have records from orthopedics regarding xray or notes.   Advised that he refrain from alcohol to relieve pain. Referral placed to orthopedics as requested.

## 2020-03-26 NOTE — Patient Instructions (Signed)
Monitor your blood pressure at home, please notify me if you consistently see readings at or above 135/90.  You will be contacted regarding your referral to orthopedics.  Please let us know if you have not been contacted within two weeks.   It was a pleasure to see you today!

## 2020-04-26 ENCOUNTER — Other Ambulatory Visit: Payer: Self-pay

## 2020-04-26 ENCOUNTER — Ambulatory Visit (INDEPENDENT_AMBULATORY_CARE_PROVIDER_SITE_OTHER): Payer: 59

## 2020-04-26 DIAGNOSIS — Z23 Encounter for immunization: Secondary | ICD-10-CM | POA: Diagnosis not present

## 2020-04-29 ENCOUNTER — Telehealth: Payer: Self-pay | Admitting: Primary Care

## 2020-04-29 NOTE — Telephone Encounter (Signed)
Pt had TB exposer though work  Years ago's.  He went years ago for an 8 month treatment .  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  Work told him it may be a false positive and to follow up with PCP  Can pt come in office of virtual??    When can pt be seen He stated they normally do chest xray

## 2020-04-29 NOTE — Telephone Encounter (Signed)
Did he have the TB skin test or TB blood draw? Where did he have this done?

## 2020-04-30 NOTE — Telephone Encounter (Signed)
Called patient had evaluated by someone his employer sent him to. He will have all records sent to our office to update chart. He did not need any further evaluation for information from our office.

## 2020-04-30 NOTE — Telephone Encounter (Signed)
Noted  

## 2021-01-20 ENCOUNTER — Telehealth: Payer: Self-pay

## 2021-01-20 NOTE — Telephone Encounter (Signed)
[facility] Night - Client Nonclinical Telephone Record  [facility] Client [facility] Night - Client Client Site [facility] - Night Physician Vernona Rieger - NP Contact Type Call Who Is Calling Patient / Member / Family / Caregiver Caller Name Francis Herman Phone Number [phone] Patient Name Francis Herman Patient DOB [birth year] Call Type Message Only Information Provided Reason for Call Request for General Office Information Initial Comment Caller states he is trying to access his records on myChart to print his last flu shot record but it is only allowing him to access it. He is wondering if office can send the record to him so he can print it. Additional Comment Office hours provided. Disp. Time Disposition Final User 01/19/2021 5:04:09 PM General Information Provided Yes Marinus Maw Call Closed By: Marinus Maw Transaction Date/Time: 01/19/2021 5:00:42 PM (ET)

## 2021-02-05 ENCOUNTER — Encounter: Payer: Self-pay | Admitting: Primary Care

## 2021-02-05 ENCOUNTER — Ambulatory Visit (INDEPENDENT_AMBULATORY_CARE_PROVIDER_SITE_OTHER): Payer: 59 | Admitting: Primary Care

## 2021-02-05 ENCOUNTER — Other Ambulatory Visit: Payer: Self-pay

## 2021-02-05 DIAGNOSIS — G8929 Other chronic pain: Secondary | ICD-10-CM | POA: Diagnosis not present

## 2021-02-05 DIAGNOSIS — M25561 Pain in right knee: Secondary | ICD-10-CM

## 2021-02-05 DIAGNOSIS — M25551 Pain in right hip: Secondary | ICD-10-CM

## 2021-02-05 MED ORDER — KETOROLAC TROMETHAMINE 60 MG/2ML IM SOLN
60.0000 mg | Freq: Once | INTRAMUSCULAR | Status: AC
  Administered 2021-02-05: 60 mg via INTRAMUSCULAR

## 2021-02-05 MED ORDER — DICLOFENAC SODIUM 1 % EX GEL: 2.0000 g | Freq: Three times a day (TID) | CUTANEOUS | 0 refills | Status: AC | PRN

## 2021-02-05 NOTE — Progress Notes (Signed)
Subjective:    Patient ID: Francis Herman, male    DOB: 05/24/1981, 40 y.o.   MRN: [id number]  HPI  Francis Herman is a very pleasant 40 y.o. male with a history of chronic knee pain, chronic hip pain who presents today to discuss hip and hand pain.  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 He works as a Engineer, civil (consulting). Prior to his increased pain [place] was [place] the [place]. He is requesting a note to excuse him from working until he sees his orthopedist, start date of today. He has an appointment scheduled for July 13th. He needs another knee replacement but is trying to hold off until his mid 40's.   He's been wearing knee compression sleeves and crutches without much improvement. He's not taking anything OTC as Ibuprofen is not effective. Meloxicam has been ineffective in the past.   He was working on his patio at home, slipped three days ago, activated the drill that was laying on the ground, put his hand out to prevent his fall which landed on the drill. His last tetanus was 10/01/20. He denies increased swelling, erythema, drainage. His site is improving.    BP Readings from Last 3 Encounters:  02/05/21 138/78  03/26/20 (!) 150/100  03/07/18 140/86     Review of Systems  Musculoskeletal:  Positive for arthralgias.  Skin:  Positive for wound. Negative for color change.        Past Medical History:  Diagnosis Date   Acute meniscal tear of right knee    Alcohol abuse    Arthritis    R OA knee   High cholesterol    Wears glasses     Social History   Socioeconomic History   Marital status: Single    Spouse name: Not on file   Number of children: Not on file   Years of education: Not on file   Highest education level: Not on file  Occupational History   Not on file  Tobacco Use   Smoking status: Never   Smokeless tobacco: Never  Substance and Sexual Activity    Alcohol use: No    Alcohol/week: 3.0 standard drinks    Types: 3 Standard drinks or equivalent per week    Comment: socially   Drug use: No   Sexual activity: Not on file  Other Topics Concern   Not on file  Social History Narrative   ** Merged History Encounter **       Divorced; from [place]; [place] since 2000      Children: 2 children      Employment: Works for [employer].       Tobacco: none      Exercise: biking   Enjoys spending time with his children, watching sports.       Social Determinants of Health   Financial Resource Strain: Not on file  Food Insecurity: Not on file  Transportation Needs: Not on file  Physical Activity: Not on file  Stress: Not on file  Social Connections: Not on file  Intimate Partner Violence: Not on file    Past Surgical History:  Procedure Laterality Date   KNEE ARTHROSCOPY WITH MEDIAL MENISECTOMY Right 06/05/2013   Procedure:  Right Knee Arthroscopy with Medial Meniscectomy Chondroplasty and Micro Fracture;  Surgeon: Eugenia Mcalpine, MD;  Location: [facility];  Service: Orthopedics;  Laterality: Right;   KNEE ARTHROSCOPY WITH MENISCAL REPAIR Left 2009    Family History  Problem Relation Age of Onset   Arthritis Maternal Grandmother    Hypertension Maternal Grandfather    Parkinsonism Father    Hyperlipidemia Father    Syncope episode Father     No Known Allergies  No current outpatient medications on file prior to visit.   No current facility-administered medications on file prior to visit.    BP 138/78   Pulse 64   Temp 97.8 F (36.6 C) (Temporal)   Ht 6' 0.25" (1.835 m)   Wt 250 lb (113.4 kg)   SpO2 95%   BMI 33.67 kg/m  Objective:   Physical Exam Musculoskeletal:     Right knee: Swelling present. Decreased range of motion. Tenderness present.     Left knee: No swelling. Normal range of motion.     Comments: 5/5 strength bilaterally. Ambulating with crutches.   Skin:    General: Skin is warm  and dry.     Comments: Puncture wound to mid nail of left 3rd digit.  No erythema. Mild swelling.    Neurological:     Mental Status: He is alert.          Assessment & Plan:      This visit occurred during the SARS-CoV-2 public health emergency.  Safety protocols were in place, including screening questions prior to the visit, additional usage of staff PPE, and extensive cleaning of exam room while observing appropriate contact time as indicated for disinfecting solutions.

## 2021-02-05 NOTE — Assessment & Plan Note (Signed)
Increased over last week. Agree to provide him with a work excuse with start date of June 30th through July 13th, returning July 14th.   He will see orthopedics on July 14th.   Rx for Voltaren Gel sent to pharmacy. IM Toradol 60 mg provided today.

## 2021-02-05 NOTE — Patient Instructions (Signed)
You can apply the diclofenac gel three times daily for knee pain.  Follow up with the orthopedic doctor as scheduled.   It was a pleasure to see you today!

## 2021-02-05 NOTE — Addendum Note (Signed)
Addended by: Donnamarie Poag on: 02/05/2021 02:54 PM   Modules accepted: Orders

## 2021-02-05 NOTE — Assessment & Plan Note (Signed)
Increased over last week. Agree to provide him with a work excuse with start date of June 30th through July 13th, returning July 14th.   He will see orthopedics on July 14th.

## 2021-11-15 ENCOUNTER — Emergency Department (HOSPITAL_COMMUNITY)
Admission: EM | Admit: 2021-11-15 | Discharge: 2021-11-15 | Disposition: A | Discharge status: Home / Self Care | Payer: Self-pay | Attending: Emergency Medicine | Admitting: Emergency Medicine

## 2021-11-15 ENCOUNTER — Other Ambulatory Visit: Payer: Self-pay

## 2021-11-15 ENCOUNTER — Encounter (HOSPITAL_COMMUNITY): Payer: Self-pay

## 2021-11-15 DIAGNOSIS — S80912A Unspecified superficial injury of left knee, initial encounter: Secondary | ICD-10-CM | POA: Insufficient documentation

## 2021-11-15 DIAGNOSIS — W1830XA Fall on same level, unspecified, initial encounter: Secondary | ICD-10-CM | POA: Insufficient documentation

## 2021-11-15 DIAGNOSIS — M25562 Pain in left knee: Secondary | ICD-10-CM

## 2021-11-15 DIAGNOSIS — Y9301 Activity, walking, marching and hiking: Secondary | ICD-10-CM | POA: Insufficient documentation

## 2021-11-15 NOTE — ED Provider Notes (Signed)
?[facility]ERGENCY DEPT ?[facility] Emergency Department ?Provider Note ?MRN:  [id number]  ?Arrival date & time: 11/15/21    ? ?Chief Complaint   ?Knee Injury ?  ?History of Present Illness   ?Francis Herman is a 41 y.o. year-old male with no prior past medical history presenting to the ED with chief complaint of knee injury. ? ?Knee buckled this evening and he fell to the ground.  No head trauma, no loss of consciousness.  No chest pain or shortness of breath, no abdominal pain.  Left knee has been having issues for several weeks, pain is no different from chronic pain. ? ?Review of Systems  ?A thorough review of systems was obtained and all systems are negative except as noted in the HPI and PMH.  ? ?Patient's Health History   ? ?Past Medical History:  ?Diagnosis Date  ? Acute meniscal tear of right knee   ? Alcohol abuse   ? Arthritis   ? R OA knee  ? High cholesterol   ? Wears glasses   ?  ?Past Surgical History:  ?Procedure Laterality Date  ? KNEE ARTHROSCOPY WITH MEDIAL MENISECTOMY Right 06/05/2013  ? Procedure:  Right Knee Arthroscopy with Medial Meniscectomy Chondroplasty and Micro Fracture;  Surgeon: Eugenia Mcalpine, MD;  Location: [facility];  Service: Orthopedics;  Laterality: Right;  ? KNEE ARTHROSCOPY WITH MENISCAL REPAIR Left 2009  ?  ?Family History  ?Problem Relation Age of Onset  ? Arthritis Maternal Grandmother   ? Hypertension Maternal Grandfather   ? Parkinsonism Father   ? Hyperlipidemia Father   ? Syncope episode Father   ?  ?Social History  ? ?Socioeconomic History  ? Marital status: Single  ?  Spouse name: Not on file  ? Number of children: Not on file  ? Years of education: Not on file  ? Highest education level: Not on file  ?Occupational History  ? Not on file  ?Tobacco Use  ? Smoking status: Never  ? Smokeless tobacco: Never  ?Substance and Sexual Activity  ? Alcohol use: No  ?  Alcohol/week: 3.0 standard drinks  ?  Types: 3 Standard drinks or equivalent per week  ?   Comment: socially  ? Drug use: No  ? Sexual activity: Not on file  ?Other Topics Concern  ? Not on file  ?Social History Narrative  ? ** Merged History Encounter **  ?    ? Divorced; from [place]; [place] since 2000  ?    Children: 2 children  ?    Employment: Works for [employer].  ?     Tobacco: none  ?    Exercise: biking  ? Enjoys spending time with his children, watching sports.   ?   ? ?Social Determinants of Health  ? ?Financial Resource Strain: Not on file  ?Food Insecurity: Not on file  ?Transportation Needs: Not on file  ?Physical Activity: Not on file  ?Stress: Not on file  ?Social Connections: Not on file  ?Intimate Partner Violence: Not on file  ?  ? ?Physical Exam  ? ?Vitals:  ? 11/15/21 0445 11/15/21 0500  ?BP: (!) 161/117 133/78  ?Pulse: 93 74  ?Resp:    ?Temp:    ?SpO2: 98% 99%  ?  ?CONSTITUTIONAL: Well-appearing, NAD ?NEURO/PSYCH:  Alert and oriented x 3, no focal deficits, mildly intoxicated ?EYES:  eyes equal and reactive ?ENT/NECK:  no LAD, no JVD ?CARDIO: Regular rate, well-perfused, normal S1 and S2 ?PULM:  CTAB no wheezing or rhonchi ?  GI/GU:  non-distended, non-tender ?MSK/SPINE:  No gross deformities, no edema ?SKIN:  no rash, atraumatic ? ? ?*Additional and/or pertinent findings included in MDM below ? ?Diagnostic and Interventional Summary  ? ? EKG Interpretation ? ?Date/Time:    ?Ventricular Rate:    ?PR Interval:    ?QRS Duration:   ?QT Interval:    ?QTC Calculation:   ?R Axis:     ?Text Interpretation:   ?  ? ?  ? ?Labs Reviewed - No data to display  ?No orders to display  ?  ?Medications - No data to display  ? ?Procedures  /  Critical Care ?Procedures ? ?ED Course and Medical Decision Making  ?Initial Impression and Ddx ?Alcohol intoxication with fall due to knee buckling.  Some pain with range of motion but largely preserved.  X-ray offered but declined by patient, explains that he just wants to go home, has follow-up with orthopedics.  No emergent process, appropriate for  discharge. ? ?Past medical/surgical history that increases complexity of ED encounter: None ? ?Interpretation of Diagnostics ?Not applicable ? ?Patient Reassessment and Ultimate Disposition/Management ?Discharge home.  Of note patient requesting oxycodone prescription, advised Tylenol and Motrin and follow-up with orthopedics. ? ?Patient management required discussion with the following services or consulting groups:  None ? ?Complexity of Problems Addressed ?Acute complicated illness or Injury ? ?Additional Data Reviewed and Analyzed ?Further history obtained from: ?None ? ?Additional Factors Impacting ED Encounter Risk ?None ? ?Elmer Sow. Pilar Plate, MD ?[facility] Emergency Medicine ?[facility] [email or web address] ? ?Final Clinical Impressions(s) / ED Diagnoses  ? ?  ICD-10-CM   ?1. Acute pain of left knee  M25.562   ?  ?  ?ED Discharge Orders   ? ? None  ? ?  ?  ? ?Discharge Instructions Discussed with and Provided to Patient:  ? ? ?Discharge Instructions   ? ?  ?You were evaluated in the Emergency Department and after careful evaluation, we did not find any emergent condition requiring admission or further testing in the hospital. ? ?Your exam/testing today was overall reassuring.  Recommend follow-up with your orthopedic specialist. ? ?Please return to the Emergency Department if you experience any worsening of your condition.  Thank you for allowing [place] to be a part of your care. ? ? ? ? ?  ?Sabas Sous, MD ?11/15/21 [phone] ? ?

## 2021-11-15 NOTE — ED Triage Notes (Signed)
BIB [facility] states he was walking downtown, knee gave out, he heard a pop, and was on the ground. + ETOH.  ?

## 2021-11-15 NOTE — Discharge Instructions (Signed)
You were evaluated in the Emergency Department and after careful evaluation, we did not find any emergent condition requiring admission or further testing in the hospital. ? ?Your exam/testing today was overall reassuring.  Recommend follow-up with your orthopedic specialist. ? ?Please return to the Emergency Department if you experience any worsening of your condition.  Thank you for allowing [place] to be a part of your care. ? ?

## 2023-03-02 ENCOUNTER — Encounter: Payer: Self-pay | Admitting: Primary Care

## 2023-03-02 ENCOUNTER — Ambulatory Visit (INDEPENDENT_AMBULATORY_CARE_PROVIDER_SITE_OTHER): Payer: 59 | Admitting: Primary Care

## 2023-03-02 VITALS — BP 124/86 | HR 80 | Temp 97.7°F | Ht 72.0 in | Wt 254.0 lb

## 2023-03-02 DIAGNOSIS — M170 Bilateral primary osteoarthritis of knee: Secondary | ICD-10-CM | POA: Diagnosis not present

## 2023-03-02 DIAGNOSIS — F4321 Adjustment disorder with depressed mood: Secondary | ICD-10-CM

## 2023-03-02 DIAGNOSIS — G8929 Other chronic pain: Secondary | ICD-10-CM

## 2023-03-02 DIAGNOSIS — Z0001 Encounter for general adult medical examination with abnormal findings: Secondary | ICD-10-CM

## 2023-03-02 DIAGNOSIS — S83212S Bucket-handle tear of medial meniscus, current injury, left knee, sequela: Secondary | ICD-10-CM

## 2023-03-02 DIAGNOSIS — M25561 Pain in right knee: Secondary | ICD-10-CM | POA: Diagnosis not present

## 2023-03-02 DIAGNOSIS — R0683 Snoring: Secondary | ICD-10-CM

## 2023-03-02 DIAGNOSIS — S83219A Bucket-handle tear of medial meniscus, current injury, unspecified knee, initial encounter: Secondary | ICD-10-CM | POA: Insufficient documentation

## 2023-03-02 DIAGNOSIS — Z0000 Encounter for general adult medical examination without abnormal findings: Secondary | ICD-10-CM

## 2023-03-02 DIAGNOSIS — M25562 Pain in left knee: Secondary | ICD-10-CM

## 2023-03-02 HISTORY — DX: Adjustment disorder with depressed mood: F43.21

## 2023-03-02 NOTE — Assessment & Plan Note (Signed)
Symptoms suspicious for sleep apnea. Referral placed to pulmonology.

## 2023-03-02 NOTE — Assessment & Plan Note (Signed)
Previously following with orthopedics. Is needing injections for both knees. He will set up a visit with Dr. Patsy Lager as insurance will not cover in a speciality setting.

## 2023-03-02 NOTE — Patient Instructions (Signed)
Stop by the lab prior to leaving today. I will notify you of your results once received.   You will either be contacted via phone regarding your referral to pulmonology for the sleep study and therapy, or you may receive a letter on your MyChart portal from our referral team with instructions for scheduling an appointment. Please let us know if you have not been contacted by anyone within two weeks.  Schedule an appointment with our sports medicine doctor, Dr. Patsy Lager.  It was a pleasure to see you today!

## 2023-03-02 NOTE — Progress Notes (Signed)
Subjective:    Patient ID: Francis Herman, male    DOB: 1981/07/28, 42 y.o.   MRN: [id number]  HPI  Francis Herman is a very pleasant 42 y.o. male who presents today for complete physical and follow up of chronic conditions.  He is also needing a urine drug screen for visiting rights for his 2 daughters. He denies use of illegal drugs.   He would also like to discuss sleep apnea. He has a chronic history of snoring. He's been observed to experience periods of apnea during sleep by several people. He denies daytime tiredness. He's never undergone a sleep study.   He would also like to meet with therapy. He lost his father in March 2024, sepsis after a surgery that he needed after a fall. Since then he's had a hard time coping as he did have to perform CPR on his father before he passed.   He is also asking for a letter for continue with sedentary work. 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 Due to his chronic knee pain and weakness he cannot stand or walk for more than 30 minutes. He does follow with orthopedics though [facility], needs injections. There is no plan for surgery at this time due to his younger age. He uses crutches or a cane on a daily basis for balance.   He is also asking for a handicap placard for chronic knee pain. He cannot walk long distances. He had a handicap placard through his orthopedist which has expired.      03/02/2023    2:15 PM  PHQ9 SCORE ONLY  PHQ-9 Total Score 0     Immunizations: -Tetanus: Completed in 2016  Diet: Fair diet.  Exercise: No regular exercise.  Eye exam: Completes annually  Dental exam: Completes semi-annually    BP Readings from Last 3 Encounters:  03/02/23 124/86  11/15/21 133/78  02/05/21 120/62         Review of Systems  Constitutional:  Negative for unexpected weight  change.  HENT:  Negative for rhinorrhea.   Respiratory:  Negative for cough and shortness of breath.   Cardiovascular:  Negative for chest pain.  Gastrointestinal:  Negative for constipation and diarrhea.  Genitourinary:  Negative for difficulty urinating.  Musculoskeletal:  Positive for arthralgias.  Skin:  Negative for rash.  Allergic/Immunologic: Negative for environmental allergies.  Neurological:  Negative for dizziness and headaches.  Psychiatric/Behavioral:         Depression, see HPI         Past Medical History:  Diagnosis Date   Acute meniscal tear of right knee    Alcohol abuse    Arthritis    R OA knee   High cholesterol    Wears glasses     Social History   Socioeconomic History   Marital status: Single    Spouse name: Not on file   Number of children: Not on file   Years of education: Not on file   Highest education level: Not on file  Occupational History   Not on file  Tobacco Use   Smoking status: Never   Smokeless tobacco: Never  Substance and Sexual Activity   Alcohol use: No    Alcohol/week: 3.0 standard drinks of alcohol    Types: 3 Standard drinks or equivalent per week    Comment:  socially   Drug use: No   Sexual activity: Not on file  Other Topics Concern   Not on file  Social History Narrative   ** Merged History Encounter **       Divorced; from [place]; [place] since 2000      Children: 2 children      Employment: Works for [employer].       Tobacco: none      Exercise: biking   Enjoys spending time with his children, watching sports.       Social Determinants of Health   Financial Resource Strain: Not on file  Food Insecurity: Not on file  Transportation Needs: Not on file  Physical Activity: Not on file  Stress: Not on file  Social Connections: Unknown (12/08/2021)   Received from [facility]   Social Network    Social Network: Not on file  Intimate Partner Violence: Unknown (11/12/2021)   Received  from [facility], [facility]   HITS    Physically Hurt: Not on file    Insult or Talk Down To: Not on file    Threaten Physical Harm: Not on file    Scream or Curse: Not on file    Past Surgical History:  Procedure Laterality Date   KNEE ARTHROSCOPY WITH MEDIAL MENISECTOMY Right 06/05/2013   Procedure:  Right Knee Arthroscopy with Medial Meniscectomy Chondroplasty and Micro Fracture;  Surgeon: Eugenia Mcalpine, MD;  Location: [facility];  Service: Orthopedics;  Laterality: Right;   KNEE ARTHROSCOPY WITH MENISCAL REPAIR Left 2009    Family History  Problem Relation Age of Onset   Arthritis Maternal Grandmother    Hypertension Maternal Grandfather    Parkinsonism Father    Hyperlipidemia Father    Syncope episode Father     No Known Allergies  Current Outpatient Medications on File Prior to Visit  Medication Sig Dispense Refill   diclofenac Sodium (VOLTAREN) 1 % GEL Apply 2 g topically 3 (three) times daily as needed (pain). 100 g 0   No current facility-administered medications on file prior to visit.    BP 124/86   Pulse 80   Temp 97.7 F (36.5 C) (Temporal)   Ht 6' (1.829 m)   Wt 254 lb (115.2 kg)   SpO2 98%   BMI 34.45 kg/m  Objective:   Physical Exam HENT:     Right Ear: Tympanic membrane and ear canal normal.     Left Ear: Tympanic membrane and ear canal normal.     Nose: Nose normal.     Right Sinus: No maxillary sinus tenderness or frontal sinus tenderness.     Left Sinus: No maxillary sinus tenderness or frontal sinus tenderness.  Eyes:     Conjunctiva/sclera: Conjunctivae normal.  Neck:     Thyroid: No thyromegaly.     Vascular: No carotid bruit.  Cardiovascular:     Rate and Rhythm: Normal rate and regular rhythm.     Heart sounds: Normal heart sounds.  Pulmonary:     Effort: Pulmonary effort is normal.     Breath sounds: Normal breath sounds. No wheezing or rales.  Abdominal:     General: Bowel sounds are normal.      Palpations: Abdomen is soft.     Tenderness: There is no abdominal tenderness.  Musculoskeletal:     Cervical back: Neck supple.     Right knee: Decreased range of motion.     Left knee: Decreased range of motion.  Comments: Decrease in range of motion to bilateral knees with flexion and extension.  Unable to flex to 90 degree angle bilaterally.  Incomplete full extension bilaterally.  Skin:    General: Skin is warm and dry.  Neurological:     Mental Status: He is alert and oriented to person, place, and time.     Cranial Nerves: No cranial nerve deficit.     Deep Tendon Reflexes: Reflexes are normal and symmetric.  Psychiatric:        Mood and Affect: Mood normal.           Assessment & Plan:  Encounter for annual general medical examination with abnormal findings in adult Assessment & Plan: Immunizations UTD.  Discussed the importance of a healthy diet and regular exercise in order for weight loss, and to reduce the risk of further co-morbidity.  Exam stable. Labs pending.  Follow up in 1 year for repeat physical.   Orders: -     Lipid panel -     Comprehensive metabolic panel -     Hemoglobin A1c -     CBC  Bucket-handle tear of medial meniscus of left knee as current injury, sequela Assessment & Plan: Previously following with orthopedics. Is needing injections for both knees. He will set up a visit with Dr. Patsy Lager as insurance will not cover in a speciality setting.   Chronic pain of both knees Assessment & Plan: Chronic and continued to both knees. He will schedule an appointment with sports medicine.  Continue orthopedic evaluation.   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 Agree to provide handicap placard.   Osteoarthritis of both knees, unspecified osteoarthritis type Assessment & Plan: Following with orthopedics. He will schedule an appointment with sports medicine for steroid injections.  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   Grief Assessment & Plan: Condolences provided.  Referral placed for therapy.  Orders: -     Ambulatory referral to Psychology  Snoring Assessment & Plan: Symptoms suspicious for sleep apnea.  Referral placed to pulmonology.  Orders: -     Ambulatory referral to Pulmonology        Doreene Nest, NP

## 2023-03-02 NOTE — Assessment & Plan Note (Addendum)
Chronic and continued to both knees. He will schedule an appointment with sports medicine.  Continue orthopedic evaluation.   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 Agree to provide handicap placard.

## 2023-03-02 NOTE — Assessment & Plan Note (Signed)
Condolences provided.  Referral placed for therapy.

## 2023-03-02 NOTE — Addendum Note (Signed)
Addended by: Doreene Nest on: 03/02/2023 02:45 PM   Modules accepted: Orders

## 2023-03-02 NOTE — Assessment & Plan Note (Signed)
Following with orthopedics. He will schedule an appointment with sports medicine for steroid injections.  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

## 2023-03-02 NOTE — Assessment & Plan Note (Signed)
Immunizations UTD.  Discussed the importance of a healthy diet and regular exercise in order for weight loss, and to reduce the risk of further co-morbidity.  Exam stable. Labs pending.  Follow up in 1 year for repeat physical.  

## 2023-03-03 LAB — DRUG MONITORING, PANEL 8 WITH CONFIRMATION, URINE
6 Acetylmorphine: NEGATIVE ng/mL (ref <10)
Alcohol Metabolites: NEGATIVE ng/mL (ref <500)
Amphetamines: NEGATIVE ng/mL (ref <500)
Benzodiazepines: NEGATIVE ng/mL (ref <100)
Buprenorphine, Urine: NEGATIVE ng/mL (ref <5)
Cocaine Metabolite: NEGATIVE ng/mL (ref <150)
MDMA: NEGATIVE ng/mL (ref <500)
Marijuana Metabolite: NEGATIVE ng/mL (ref <20)
Opiates: NEGATIVE ng/mL (ref <100)
Oxycodone: NEGATIVE ng/mL (ref <100)
pH: 5 (ref 4.5–9.0)

## 2023-03-03 LAB — COMPREHENSIVE METABOLIC PANEL
AST: 24 U/L (ref 0–37)
Albumin: 4.6 g/dL (ref 3.5–5.2)
Alkaline Phosphatase: 59 U/L (ref 39–117)
BUN: 12 mg/dL (ref 6–23)
CO2: 25 mEq/L (ref 19–32)
Calcium: 9.8 mg/dL (ref 8.4–10.5)
Chloride: 99 mEq/L (ref 96–112)
Creatinine, Ser: 1.1 mg/dL (ref 0.40–1.50)
GFR: 83.21 mL/min (ref >60.00)
Glucose, Bld: 77 mg/dL (ref 70–99)
Sodium: 134 mEq/L — ABNORMAL LOW (ref 135–145)
Total Bilirubin: 0.4 mg/dL (ref 0.2–1.2)

## 2023-03-03 LAB — CBC
HCT: 44.3 % (ref 39.0–52.0)
Hemoglobin: 14.5 g/dL (ref 13.0–17.0)
MCHC: 32.6 g/dL (ref 30.0–36.0)
MCV: 90.3 fl (ref 78.0–100.0)
Platelets: 294 10*3/uL (ref 150.0–400.0)
RDW: 15 % (ref 11.5–15.5)

## 2023-03-03 LAB — LIPID PANEL
Cholesterol: 203 mg/dL — ABNORMAL HIGH (ref 0–200)
LDL Cholesterol: 137 mg/dL — ABNORMAL HIGH (ref 0–99)
NonHDL: 155.16
Total CHOL/HDL Ratio: 4
Triglycerides: 90 mg/dL (ref 0.0–149.0)
VLDL: 18 mg/dL (ref 0.0–40.0)

## 2023-03-03 LAB — DM TEMPLATE

## 2023-03-03 LAB — HEMOGLOBIN A1C: Hgb A1c MFr Bld: 5.7 % (ref 4.6–6.5)

## 2023-03-13 NOTE — Progress Notes (Addendum)
Francis Heinze T. Valin Massie, MD, CAQ Sports Medicine [facility] [address] [place], [address]  Phone: [phone]  FAX: [phone]  Francis Herman - 42 y.o. male  MRN [id number]  Date of Birth: June 14, 1981  Date: 03/14/2023  PCP: Francis Nest, NP  Referral: Francis Nest, NP  Chief Complaint  Patient presents with   Knee Pain    Bilateral   Subjective:   Francis Herman is a 42 y.o. very pleasant male patient with Body mass index is 33.57 kg/m. who presents with the following:  Patient presents for discussion with chronic knee pain chronic bilateral knee osteoarthritis.  After much record review, the patient has seen multiple different orthopedic providers in the state ongoing for 10 years.  On chart review, he does have advanced knee osteoarthritis on plain x-ray.  Additional MRI findings, as well.  Advanced OA and large left sided bucket-handle meniscal tear.  Orthopedics felt like his only option would be total knee arthroplasty. -Imaging reports are as below.  He had a Worker's Comp. injury roughly 10 years ago and had knee arthroscopy by Dr. Thomasena Herman in [place] orthopedics.  DOS: 05/2013. - R arthroscopy with partial medial menisectomy. -He also has been treated and managed at [facility] orthopedics by Dr. Ivin Herman and others. -More recently he has been treated by [facility] in [place] by various physicians.  Went to [facility], more recently had a worker's comp injury, so went there.   Sister is an [facility] resident.  Now she is in [place].  Will have an injection that lasts for two to three months.   Dr. Thomasena Herman was out of pocket to see him at [facility].  He has never had viscosupplementation.   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  He does have intermittent effusions.    Review of Systems is noted in the HPI, as  appropriate  Objective:   BP 118/84 (BP Location: Right Arm, Patient Position: Sitting, Cuff Size: Large)   Pulse 76   Temp 97.7 F (36.5 C) (Temporal)   Ht 6' (1.829 m)   Wt 247 lb 8 oz (112.3 kg)   SpO2 98%   BMI 33.57 kg/m   GEN: No acute distress; alert,appropriate. PULM: Breathing comfortably in no respiratory distress PSYCH: Normally interactive.   Bilateral knee exam: He lacks 4 degrees of extension bilaterally.  Flexion is limited to 90 degrees bilaterally. He does have a mild to moderate left-sided knee effusion. 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 Moderate to advanced tricompartmental degenerative changes with joint space narrowing, and multiple large tricompartmental osteophytes.  MRI of the left knee performed in [facility] on 11/03/2021 demonstrates tricompartmental osteoarthritis with patchy areas of full-thickness cartilage loss in all 3 compartments, worst in patellofemoral compartment. Large medial meniscus bucket-handle tear, posterior root lateral meniscus tear, moderate effusion with synovial thickening and scattered loose bodies. No evidence of acute fracture, with low-grade subchondral bone marrow  edema and anteromedial tibial plateau.   Assessment and Plan:     ICD-10-CM   1. Primary osteoarthritis of knees, bilateral  M17.0 triamcinolone acetonide (KENALOG-40) injection 40 mg    triamcinolone acetonide (KENALOG-40) injection 40 mg    2. Chronic pain of both knees  M25.561    M25.562     G89.29      Total encounter time: 30 minutes. This includes total time spent on the day of encounter outside his procedure / joint injection.  Extensive chart review and discussion regarding various ways to treat advanced knee osteoarthritis conservatively.  He is in a tough situation with his advanced knee osteoarthritis bilaterally at a young age.  He wants to hold off on total knee arthroplasty for now.  Recommended a trial of curcumin and tart cherry juice concentrate.  Recommended a trial of capsaicin cream.  He has never done any of these before.  We talked about various other treatment options for arthritis.  Viscosupplementation would be a good thing to try, which she has not done before.  I tried to answer his questions about platelet rich plasma and stem cell injections, which are not FDA approved.  There is more data with platelet rich plasma, but stem cells also offers limited evidence-based medicine for positive benefit.  We will do bilateral knee injections today.  Aspiration/Injection Procedure Note Francis Herman 02-06-1981 Date of procedure: 03/14/2023  Procedure: Large Joint Joint Aspiration / Injection of the Right Knee Indications: Pain  Procedure Details Patient verbally consented to procedure. Risks, benefits, and alternatives explained. Sterilely prepped with Chloraprep. Ethyl cholride used for anesthesia. 9 cc Lidocaine 1% mixed with 1 mL Kenalog 40 mg injected using the anteromedial approach without difficulty. No complications with procedure and tolerated well. Patient had decreased pain post-injection.  Medication: 1 mL of Kenalog 40 mg  Aspiration/Injection Procedure Note Francis Herman 1980-10-05 Date of procedure: 03/14/2023  Procedure: Large Joint Aspiration / Injection of the Left Knee Indications: Pain  Procedure Details Patient verbally consented to procedure. Risks, benefits, and alternatives explained. Sterilely prepped with Chloraprep. Ethyl cholride  used for anesthesia. 9 cc Lidocaine 1% mixed with 1 mL Kenalog 40 mg injected using the anteromedial approach without difficulty. No complications with procedure and tolerated well. Patient had decreased pain post-injection.  Medication: 1 mL of Kenalog 40 mg   Medication Management during today's office visit: Meds ordered this encounter  Medications   triamcinolone acetonide (KENALOG-40) injection 40 mg   triamcinolone acetonide (KENALOG-40) injection 40 mg   There are no discontinued medications.  Orders placed today for conditions managed today: No orders of the defined types were placed in this encounter.   Disposition: No follow-ups on file.  [employer] One speech-to-text software was used for transcription in this dictation.  Possible transcriptional errors can occur using Animal nutritionist.   Signed,  Elpidio Galea. Konner Saiz, MD   Outpatient Encounter Medications as of 03/14/2023  Medication Sig   diclofenac Sodium (VOLTAREN) 1 % GEL Apply 2 g topically 3 (three) times daily as needed (pain).   [EXPIRED] triamcinolone acetonide (KENALOG-40) injection 40 mg    [EXPIRED] triamcinolone acetonide (KENALOG-40) injection 40 mg    No facility-administered encounter medications on file as of 03/14/2023.

## 2023-03-14 ENCOUNTER — Encounter: Payer: Self-pay | Admitting: Family Medicine

## 2023-03-14 ENCOUNTER — Ambulatory Visit: Payer: 59 | Admitting: Family Medicine

## 2023-03-14 VITALS — BP 118/84 | HR 76 | Temp 97.7°F | Ht 72.0 in | Wt 247.5 lb

## 2023-03-14 DIAGNOSIS — M25561 Pain in right knee: Secondary | ICD-10-CM

## 2023-03-14 DIAGNOSIS — M170 Bilateral primary osteoarthritis of knee: Secondary | ICD-10-CM | POA: Diagnosis not present

## 2023-03-14 DIAGNOSIS — M25562 Pain in left knee: Secondary | ICD-10-CM | POA: Diagnosis not present

## 2023-03-14 DIAGNOSIS — G8929 Other chronic pain: Secondary | ICD-10-CM

## 2023-03-14 MED ORDER — TRIAMCINOLONE ACETONIDE 40 MG/ML IJ SUSP
40.0000 mg | Freq: Once | INTRAMUSCULAR | Status: AC
Start: 2023-03-14 — End: 2023-03-14
  Administered 2023-03-14: 40 mg via INTRA_ARTICULAR

## 2023-03-14 NOTE — Patient Instructions (Signed)
OSTEOARTHRITIS: Over the counter  Topical Capzaicin Cream, as needed (wear glove to put on) - THIS IS EXCEPTIONALLY HOT  Supplements: Tart cherry juice and Curcumin (Turmeric extract) have good scientific evidence  - get the concentrated capsules or gelcaps over the counter so you do not get the calories from the juice.

## 2023-03-16 ENCOUNTER — Ambulatory Visit: Payer: Self-pay | Admitting: Family Medicine

## 2023-04-07 ENCOUNTER — Encounter: Payer: Self-pay | Admitting: Internal Medicine

## 2023-04-07 ENCOUNTER — Ambulatory Visit (INDEPENDENT_AMBULATORY_CARE_PROVIDER_SITE_OTHER): Payer: 59 | Admitting: Internal Medicine

## 2023-04-07 VITALS — BP 124/74 | HR 60 | Temp 98.2°F | Ht 76.0 in | Wt 237.0 lb

## 2023-04-07 DIAGNOSIS — G4719 Other hypersomnia: Secondary | ICD-10-CM

## 2023-04-07 NOTE — Progress Notes (Signed)
Name: Francis Herman MRN: [id number] DOB: [birth year]    CHIEF COMPLAINT:  EXCESSIVE DAYTIME SLEEPINESS   HISTORY OF PRESENT ILLNESS: Patient is seen today for problems and issues with sleep related to excessive daytime sleepiness Patient  has been having sleep problems for many years Patient has been having excessive daytime sleepiness for a long time Patient has been having extreme fatigue and tiredness, lack of energy +  very Loud snoring every night + struggling breathe at night and gasps for air  Discussed sleep data and reviewed with patient.  Encouraged proper weight management.  Discussed driving precautions and its relationship with hypersomnolence.  Discussed operating dangerous equipment and its relationship with hypersomnolence.  Discussed sleep hygiene, and benefits of a fixed sleep waked time.  The importance of getting eight or more hours of sleep discussed with patient.  Discussed limiting the use of the computer and television before bedtime.  Decrease naps during the day, so night time sleep will become enhanced.  Limit caffeine, and sleep deprivation.  HTN, stroke, and heart failure are potential risk factors.    EPWORTH SLEEP SCORE 0   No evidence of heart failure at this time No evidence or signs of infection at this time No respiratory distress No fevers, chills, nausea, vomiting, diarrhea No evidence of lower extremity edema No evidence hemoptysis   PAST MEDICAL HISTORY :   has a past medical history of Acute meniscal tear of right knee, Alcohol abuse, Arthritis, High cholesterol, and Wears glasses.  has a past surgical history that includes Knee arthroscopy with meniscal repair (Left, 2009) and Knee arthroscopy with medial menisectomy (Right, 06/05/2013). Prior to Admission medications   Medication Sig Start Date End Date Taking? Authorizing Provider  diclofenac Sodium (VOLTAREN) 1 % GEL Apply 2 g topically 3 (three) times daily as needed (pain).  02/05/21  Yes Doreene Nest, NP   No Known Allergies  FAMILY HISTORY:  family history includes Arthritis in his maternal grandmother; Hyperlipidemia in his father; Hypertension in his maternal grandfather; Parkinsonism in his father; Syncope episode in his father. SOCIAL HISTORY:  reports that he has never smoked. He has never used smokeless tobacco. He reports that he does not drink alcohol and does not use drugs.   Review of Systems:  Gen:  Denies  fever, sweats, chills weight loss  HEENT: Denies blurred vision, double vision, ear pain, eye pain, hearing loss, nose bleeds, sore throat Cardiac:  No dizziness, chest pain or heaviness, chest tightness,edema, No JVD Resp:   No cough, -sputum production, -shortness of breath,-wheezing, -hemoptysis,  Gi: Denies swallowing difficulty, stomach pain, nausea or vomiting, diarrhea, constipation, bowel incontinence Gu:  Denies bladder incontinence, burning urine Ext:   Denies Joint pain, stiffness or swelling Skin: Denies  skin rash, easy bruising or bleeding or hives Endoc:  Denies polyuria, polydipsia , polyphagia or weight change Psych:   Denies depression, insomnia or hallucinations  Other:  All other systems negative   ALL OTHER ROS ARE NEGATIVE  BP 124/74 (BP Location: Left Arm, Cuff Size: Normal)   Pulse 60   Temp 98.2 F (36.8 C) (Temporal)   Ht 6\' 4"  (1.93 m)   Wt 237 lb (107.5 kg)   SpO2 97%   BMI 28.85 kg/m     Physical Examination:   General Appearance: No distress  EYES PERRLA, EOM intact.   NECK Supple, No JVD Pulmonary: normal breath sounds, No wheezing.  CardiovascularNormal S1,S2.  No m/r/g.   Abdomen: Benign, Soft, non-tender. Skin:  warm, no rashes, no ecchymosis  Extremities: normal, no cyanosis, clubbing. Neuro:without focal findings,  speech normal  PSYCHIATRIC: Mood, affect within normal limits.   ALL OTHER ROS ARE NEGATIVE    ASSESSMENT AND PLAN SYNOPSIS  Patient with signs and symptoms  of excessive daytime sleepiness with probable underlying diagnosis of obstructive sleep apnea in the setting of obesity and deconditioned state   Recommend Sleep Study for definitve diagnosis  Obesity -recommend significant weight loss -recommend changing diet  Deconditioned state -Recommend increased daily activity and exercise   MEDICATION ADJUSTMENTS/LABS AND TESTS ORDERED: Recommend Sleep Study Recommend weight loss   CURRENT MEDICATIONS REVIEWED AT LENGTH WITH PATIENT TODAY   Patient  satisfied with Plan of action and management. All questions answered  Follow up  1 months  Total Time Spent  35 mins   Wallis Bamberg Santiago Glad, M.D.  [facility]  Medical Director [facility] Medical Director [facility] Cardio-Pulmonary Department

## 2023-04-07 NOTE — Patient Instructions (Addendum)
Obtain sleep study to assess for underlying sleep apnea  Recommend weight loss

## 2023-04-21 ENCOUNTER — Telehealth: Payer: Self-pay | Admitting: Internal Medicine

## 2023-04-21 NOTE — Telephone Encounter (Signed)
Patient states he no longer has any of the [employer]. He only has the [facility] plan. I told him I would call his insurance and then call him back

## 2023-04-21 NOTE — Telephone Encounter (Signed)
Patient states SNAP is too expensive. Patient states insurance will cover if he gets the home sleep test thru the office. Patient phone number is [phone].

## 2023-04-28 NOTE — Telephone Encounter (Signed)
I spoke with Francis Herman this morning and explained what his insurance told me. He would have to pay 25% coinsurance, no deductible, 1,800.00 out of pocket and he has only met 119.09 of that. I explained that Gleason and I had talked about it would probably be cheaper to go through [facility]. They charge 1 flat fee and then we charge for the reading of the sleep study. He will decide what he is going to do and call us back

## 2023-05-02 ENCOUNTER — Institutional Professional Consult (permissible substitution): Payer: 59 | Admitting: Adult Health

## 2023-05-06 ENCOUNTER — Encounter: Payer: Self-pay | Admitting: *Deleted

## 2023-05-06 ENCOUNTER — Telehealth: Payer: 59 | Admitting: Adult Health

## 2023-06-05 DIAGNOSIS — S8990XA Unspecified injury of unspecified lower leg, initial encounter: Secondary | ICD-10-CM | POA: Insufficient documentation

## 2023-08-05 DIAGNOSIS — S0990XA Unspecified injury of head, initial encounter: Secondary | ICD-10-CM | POA: Insufficient documentation

## 2023-08-05 DIAGNOSIS — S14101A Unspecified injury at C1 level of cervical spinal cord, initial encounter: Secondary | ICD-10-CM | POA: Insufficient documentation

## 2023-08-09 ENCOUNTER — Telehealth: Payer: 59

## 2023-08-09 ENCOUNTER — Emergency Department (HOSPITAL_BASED_OUTPATIENT_CLINIC_OR_DEPARTMENT_OTHER)
Admission: EM | Admit: 2023-08-09 | Discharge: 2023-08-10 | Disposition: A | Discharge status: Home / Self Care | Payer: Worker's Compensation | Attending: Emergency Medicine | Admitting: Emergency Medicine

## 2023-08-09 ENCOUNTER — Encounter (HOSPITAL_BASED_OUTPATIENT_CLINIC_OR_DEPARTMENT_OTHER): Payer: Self-pay | Admitting: Emergency Medicine

## 2023-08-09 ENCOUNTER — Other Ambulatory Visit: Payer: Self-pay

## 2023-08-09 DIAGNOSIS — F0781 Postconcussional syndrome: Secondary | ICD-10-CM | POA: Insufficient documentation

## 2023-08-09 DIAGNOSIS — Y990 Civilian activity done for income or pay: Secondary | ICD-10-CM | POA: Insufficient documentation

## 2023-08-09 DIAGNOSIS — W228XXA Striking against or struck by other objects, initial encounter: Secondary | ICD-10-CM | POA: Diagnosis not present

## 2023-08-09 DIAGNOSIS — R519 Headache, unspecified: Secondary | ICD-10-CM | POA: Insufficient documentation

## 2023-08-09 DIAGNOSIS — M542 Cervicalgia: Secondary | ICD-10-CM | POA: Insufficient documentation

## 2023-08-09 DIAGNOSIS — M25521 Pain in right elbow: Secondary | ICD-10-CM | POA: Insufficient documentation

## 2023-08-09 LAB — COMPREHENSIVE METABOLIC PANEL
ALT: 14 U/L (ref 0–44)
AST: 12 U/L — ABNORMAL LOW (ref 15–41)
Albumin: 4.1 g/dL (ref 3.5–5.0)
Alkaline Phosphatase: 60 U/L (ref 38–126)
Anion gap: 7 (ref 5–15)
BUN: 23 mg/dL — ABNORMAL HIGH (ref 6–20)
CO2: 27 mmol/L (ref 22–32)
Calcium: 9 mg/dL (ref 8.9–10.3)
Chloride: 104 mmol/L (ref 98–111)
Creatinine, Ser: 1.02 mg/dL (ref 0.61–1.24)
GFR, Estimated: >60 mL/min (ref >60)
Glucose, Bld: 92 mg/dL (ref 70–99)
Potassium: 4.1 mmol/L (ref 3.5–5.1)
Sodium: 138 mmol/L (ref 135–145)
Total Bilirubin: 0.3 mg/dL (ref 0.0–1.2)
Total Protein: 7 g/dL (ref 6.5–8.1)

## 2023-08-09 LAB — CBC
HCT: 43.7 % (ref 39.0–52.0)
Hemoglobin: 14.3 g/dL (ref 13.0–17.0)
MCH: 28.6 pg (ref 26.0–34.0)
MCHC: 32.7 g/dL (ref 30.0–36.0)
MCV: 87.4 fL (ref 80.0–100.0)
Platelets: 304 10*3/uL (ref 150–400)
RBC: 5 MIL/uL (ref 4.22–5.81)
RDW: 12.8 % (ref 11.5–15.5)
WBC: 5.3 10*3/uL (ref 4.0–10.5)
nRBC: 0 % (ref 0.0–0.2)

## 2023-08-09 MED ORDER — KETOROLAC TROMETHAMINE 60 MG/2ML IM SOLN
30.0000 mg | Freq: Once | INTRAMUSCULAR | Status: AC
  Administered 2023-08-09: 30 mg via INTRAMUSCULAR
  Filled 2023-08-09: qty 2

## 2023-08-09 MED ORDER — DIPHENHYDRAMINE HCL 25 MG PO CAPS
25.0000 mg | ORAL_CAPSULE | Freq: Once | ORAL | Status: AC
  Administered 2023-08-09: 25 mg via ORAL
  Filled 2023-08-09: qty 1

## 2023-08-09 MED ORDER — METOCLOPRAMIDE HCL 10 MG PO TABS
10.0000 mg | ORAL_TABLET | Freq: Once | ORAL | Status: AC
  Administered 2023-08-09: 10 mg via ORAL
  Filled 2023-08-09: qty 1

## 2023-08-09 NOTE — ED Provider Notes (Signed)
 [place] EMERGENCY DEPARTMENT AT [facility] Provider Note   CSN: [id number] Arrival date & time: 08/09/23  1305     History  No chief complaint on file.   Francis Herman is a 42 y.o. male.  HPI Patient presents for intermittent headaches.  Medical history includes alcohol abuse, arthritis, HLD.  4 days ago, he had an accident at work, during which he struck his head on an OR light.  He had a brief LOC.  He was seen in outside hospital ED that day and underwent CT imaging of head and cervical spine.  Imaging results were negative for acute injuries.  He has been taking ibuprofen  as needed.  He has had continued headaches, photosensitivity, neck pain.  He will experience intermittent aching in his right elbow.    Home Medications Prior to Admission medications   Medication Sig Start Date End Date Taking? Authorizing Provider  cyclobenzaprine  (FLEXERIL ) 10 MG tablet Take 10 mg by mouth 3 (three) times daily. 08/05/23  Yes [provider]  ibuprofen  (ADVIL ) 800 MG tablet Take 800 mg by mouth 3 (three) times daily. 08/05/23 08/12/23 Yes [provider]  diclofenac  Sodium (VOLTAREN ) 1 % GEL Apply 2 g topically 3 (three) times daily as needed (pain). 02/05/21   Gretta Comer POUR, NP      Allergies    Piritramide    Review of Systems   Review of Systems  Musculoskeletal:  Positive for arthralgias and neck pain.  Neurological:  Positive for headaches.  All other systems reviewed and are negative.   Physical Exam Updated Vital Signs BP 113/74   Pulse 63   Temp 98.1 F (36.7 C) (Oral)   Resp 18   SpO2 98%  Physical Exam Vitals and nursing note reviewed.  Constitutional:      General: He is not in acute distress.    Appearance: Normal appearance. He is well-developed. He is not ill-appearing, toxic-appearing or diaphoretic.  HENT:     Head: Normocephalic and atraumatic.     Right Ear: External ear normal.     Left Ear: External ear normal.      Nose: Nose normal.     Mouth/Throat:     Mouth: Mucous membranes are moist.  Eyes:     Extraocular Movements: Extraocular movements intact.     Conjunctiva/sclera: Conjunctivae normal.  Neck:     Comments: Wearing soft neck brace Cardiovascular:     Rate and Rhythm: Normal rate and regular rhythm.  Pulmonary:     Effort: Pulmonary effort is normal. No respiratory distress.  Abdominal:     General: There is no distension.     Palpations: Abdomen is soft.  Musculoskeletal:        General: No swelling, tenderness or deformity. Normal range of motion.     Cervical back: Neck supple. No rigidity.  Skin:    General: Skin is warm and dry.     Capillary Refill: Capillary refill takes less than 2 seconds.     Coloration: Skin is not jaundiced or pale.  Neurological:     General: No focal deficit present.     Mental Status: He is alert and oriented to person, place, and time.     Cranial Nerves: No cranial nerve deficit.     Sensory: No sensory deficit.     Motor: No weakness.     Coordination: Coordination normal.  Psychiatric:        Mood and Affect: Mood normal.  Behavior: Behavior normal.     ED Results / Procedures / Treatments   Labs (all labs ordered are listed, but only abnormal results are displayed) Labs Reviewed  COMPREHENSIVE METABOLIC PANEL - Abnormal; Notable for the following components:      Result Value   BUN 23 (*)    AST 12 (*)    All other components within normal limits  CBC    EKG None  Radiology No results found.  Procedures Procedures    Medications Ordered in ED Medications  ketorolac  (TORADOL ) injection 30 mg (30 mg Intramuscular Given 08/09/23 2342)  metoCLOPramide  (REGLAN ) tablet 10 mg (10 mg Oral Given 08/09/23 2345)  diphenhydrAMINE  (BENADRYL ) capsule 25 mg (25 mg Oral Given 08/09/23 2345)    ED Course/ Medical Decision Making/ A&P                                 Medical Decision Making Amount and/or Complexity of Data  Reviewed Labs: ordered.  Risk Prescription drug management.   Patient presents for headache and neck pain.  This does follow a recent head injury 4 days ago, during which he walked into an OR light, while at work.  He underwent CT of head and cervical spine that day which were negative for acute findings.  He describes intermittent symptoms since that time.  On exam, patient is well-appearing.  He has no focal neurologic deficits.  He describes an intermittent aching in his right elbow.  On exam, range of motion is intact and there is no areas of swelling or tenderness.  I do not suspect elbow injury.  He states that he has recently had some streaks of blood in his stool.  He describes color is red.  He thinks this may be secondary to his use of ibuprofen .  Given the small amount and color of what he describes, internal hemorrhoid more likely.  Headache and neck pain symptoms are consistent with postconcussive syndrome.  Headache cocktail was provided in the ED. symptoms resolved.  Patient was given neurology follow-up contact information.  He was discharged in stable condition.        Final Clinical Impression(s) / ED Diagnoses Final diagnoses:  Post concussion syndrome    Rx / DC Orders ED Discharge Orders     None         Melvenia Motto, MD 08/10/23 [phone]

## 2023-08-09 NOTE — Discharge Instructions (Signed)
Continue ibuprofen and Tylenol as needed for headaches.  Avoid activities that worsen your symptoms.  There is a telephone number below that you can call for neurology follow-up for persistent symptoms.

## 2023-08-09 NOTE — ED Notes (Signed)
 Pt. Asking for toradol IM

## 2023-08-09 NOTE — ED Triage Notes (Addendum)
 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  Started taking motrin  for pain after injury  with some relief Did notice some blood in stool this AM (had similar in past when taking nsaids)

## 2023-08-11 ENCOUNTER — Emergency Department (HOSPITAL_BASED_OUTPATIENT_CLINIC_OR_DEPARTMENT_OTHER)
Admission: EM | Admit: 2023-08-11 | Discharge: 2023-08-11 | Disposition: A | Discharge status: Home / Self Care | Payer: Worker's Compensation | Attending: Emergency Medicine | Admitting: Emergency Medicine

## 2023-08-11 ENCOUNTER — Other Ambulatory Visit: Payer: Self-pay

## 2023-08-11 ENCOUNTER — Ambulatory Visit: Payer: Self-pay | Admitting: Primary Care

## 2023-08-11 DIAGNOSIS — R42 Dizziness and giddiness: Secondary | ICD-10-CM | POA: Insufficient documentation

## 2023-08-11 DIAGNOSIS — R519 Headache, unspecified: Secondary | ICD-10-CM | POA: Insufficient documentation

## 2023-08-11 MED ORDER — PROCHLORPERAZINE EDISYLATE 10 MG/2ML IJ SOLN: 10.0000 mg | Freq: Once | INTRAMUSCULAR | Status: DC

## 2023-08-11 MED ORDER — BUTALBITAL-APAP-CAFFEINE 50-325-40 MG PO TABS: 1.0000 | ORAL_TABLET | Freq: Four times a day (QID) | ORAL | 0 refills | Status: DC | PRN

## 2023-08-11 MED ORDER — KETOROLAC TROMETHAMINE 15 MG/ML IJ SOLN
30.0000 mg | Freq: Once | INTRAMUSCULAR | Status: AC
  Administered 2023-08-11: 30 mg via INTRAMUSCULAR
  Filled 2023-08-11: qty 2

## 2023-08-11 MED ORDER — METOCLOPRAMIDE HCL 10 MG PO TABS
10.0000 mg | ORAL_TABLET | Freq: Once | ORAL | Status: AC
  Administered 2023-08-11: 10 mg via ORAL
  Filled 2023-08-11: qty 1

## 2023-08-11 MED ORDER — DIPHENHYDRAMINE HCL 50 MG/ML IJ SOLN: 50.0000 mg | Freq: Once | INTRAMUSCULAR | Status: DC

## 2023-08-11 NOTE — ED Provider Notes (Signed)
 [place] EMERGENCY DEPARTMENT AT [facility] Provider Note   CSN: [id number] Arrival date & time: 08/11/23  1437     History Chief Complaint  Patient presents with   Head Injury    HPI Francis Herman is a 43 y.o. male presenting for headache.  This is his third ER visit in 4 days.  He is a 43 year old male minimal medical history except for arthritis on daily anti-inflammatory as needed..  States that he hit his head at work 4 days ago on a stationary piece of equipment.  He has had daily progressive headaches worse with ambulation and accompanied by dizziness. He denies fevers or chills.  He was seen at the outside ED when it happened underwent CT scan of head and neck both of which with no acute pathology.  Headache has remained intermittent since then with daily spiking usually in the evening.  Tonight his symptoms are severe. States that urgent care told him he had to come in and get his head reimaged.  Patient's recorded medical, surgical, social, medication list and allergies were reviewed in the Snapshot window as part of the initial history.   Review of Systems   Review of Systems  Constitutional:  Negative for chills and fever.  HENT:  Negative for ear pain and sore throat.   Eyes:  Negative for pain and visual disturbance.  Respiratory:  Negative for cough and shortness of breath.   Cardiovascular:  Negative for chest pain and palpitations.  Gastrointestinal:  Negative for abdominal pain and vomiting.  Genitourinary:  Negative for dysuria and hematuria.  Musculoskeletal:  Negative for arthralgias and back pain.  Skin:  Negative for color change and rash.  Neurological:  Positive for dizziness and headaches. Negative for seizures and syncope.  All other systems reviewed and are negative.   Physical Exam Updated Vital Signs BP 138/81   Pulse 69   Temp 97.7 F (36.5 C)   Resp 16   SpO2 100%  Physical Exam Vitals and nursing note reviewed.  Constitutional:       General: He is not in acute distress.    Appearance: He is well-developed.  HENT:     Head: Normocephalic and atraumatic.  Eyes:     Conjunctiva/sclera: Conjunctivae normal.  Cardiovascular:     Rate and Rhythm: Normal rate and regular rhythm.     Heart sounds: No murmur heard. Pulmonary:     Effort: Pulmonary effort is normal. No respiratory distress.     Breath sounds: Normal breath sounds.  Abdominal:     Palpations: Abdomen is soft.     Tenderness: There is no abdominal tenderness.  Musculoskeletal:        General: No swelling.     Cervical back: Neck supple.  Skin:    General: Skin is warm and dry.     Capillary Refill: Capillary refill takes less than 2 seconds.  Neurological:     Mental Status: He is alert.  Psychiatric:        Mood and Affect: Mood normal.      ED Course/ Medical Decision Making/ A&P    Procedures Procedures   Medications Ordered in ED Medications  ketorolac  (TORADOL ) 15 MG/ML injection 30 mg (has no administration in time range)  metoCLOPramide  (REGLAN ) tablet 10 mg (has no administration in time range)   Medical Decision Making:   Francis Herman is a 43 y.o. male who presented to the ED today with headache detailed above.    Additional history discussed  with patient's family/caregivers.  Patient placed on continuous vitals and telemetry monitoring while in ED which was reviewed periodically.   Complete initial physical exam performed, notably the patient  was HDS in NAD.    Reviewed and confirmed nursing documentation for past medical history, family history, social history.    Initial Assessment:   With the patient's presentation of headache, most likely diagnosis is tension type headaches vs atypical migraines. Other diagnoses were considered including (but not limited to) intracranial mass, intracranial hemorrhage, intracranial infection including meningitis vs encephalitis, GCA, trigeminal neuralgia. These are considered less likely  due to history of present illness and physical exam findings.    This is most consistent with an acute life/limb threatening illness complicated by underlying chronic conditions.   Timeline and slow onset is not consistent with SAH/ICH   Age and description of pain is not consistent with GCA   Lack of fever,meningismus is not consistent with meningitis/encephalitis   Initial Plan:  Will initiate treatment with Reglan /Benardryll/NSAIDS/Tylenol  for treatment of nonspecific headache  Shared medical decision making reguarding CT head.  They were sent in by PCP for serial CT head, however he had initial imaging on the day of the injury that was negative.  I was able to view those results in epic.  Additionally, he has no risk factors for delayed bleeding.  Is not on Plavix has no history of complex bleeding disorder.  His symptoms including the dizziness, visual symptoms and headaches are much more consistent with postconcussive disorder.  If anything, he may need outpatient MRI if his PCP thinks this is appropriate but I cannot see any acute indication for repeat need for CT at this time especially given the very mild nature of his symptoms.  Offered to repeat this study if patient and family are insistent but given low diagnostic utility expected of study they do not want to proceed with that.  Disposition:  I have considered need for hospitalization, however, considering all of the above, I believe this patient is stable for discharge at this time.  Patient/family educated about specific return precautions for given chief complaint and symptoms.  Patient/family educated about follow-up with PCP.     Patient/family expressed understanding of return precautions and need for follow-up. Patient spoken to regarding all imaging and laboratory results and appropriate follow up for these results. All education provided in verbal form with additional information in written form. Time was allowed for answering  of patient questions. Patient discharged.    Emergency Department Medication Summary:   Medications  ketorolac  (TORADOL ) 15 MG/ML injection 30 mg (has no administration in time range)  metoCLOPramide  (REGLAN ) tablet 10 mg (has no administration in time range)          Clinical Impression:  1. Acute nonintractable headache, unspecified headache type      Data Unavailable    Clinical Impression:  1. Acute nonintractable headache, unspecified headache type      Data Unavailable   Final Clinical Impression(s) / ED Diagnoses Final diagnoses:  Acute nonintractable headache, unspecified headache type    Rx / DC Orders ED Discharge Orders          Ordered    butalbital -acetaminophen -caffeine  (FIORICET) 50-325-40 MG tablet  Every 6 hours PRN        08/11/23 2018              Jerral Meth, MD 08/11/23 2035

## 2023-08-11 NOTE — Telephone Encounter (Signed)
 Copied from CRM [id number]. Topic: Clinical - Red Word Triage >> Aug 11, 2023  1:11 PM Alfonso ORN wrote: Red Word that prompted transfer to Nurse Triage: on 12/27 head injury hit head at  work in the or and something hit his head   and having bad headache  neck pain , double blur vision dizzyness . Patient is at the emergency room   Chief Complaint: Headache  Symptoms: Headache blurred vision, neck pain, dizziness  Frequency: Constant  Disposition: [x] ED /[] Urgent Care (no appt availability in office) / [] Appointment(In office/virtual)/ []  [facility]/ [] Home Care/ [] Refused Recommended Disposition /[] [place] [facility]/ []  Follow-up with PCP Additional Notes: Patient reports he hit his head at work on 08/05/23. He states that he was evaluated at the ED at that time and was told he has a concussion. Patient reports that he is continuing to experience a headache, dizziness, and neck pain, stating his symptoms feel worse today.  Patient currently in the ED but that he was told if his provider could put an order in for the CT they would be able to expedite the scan. CAL contacted but the office is unable to put the order in at this time. Patient advised of this and instructed to remain at the ED until he is able to be evaluated and treated. He verbalized understanding.    Reason for Disposition  Concussion symptoms are worsening  Answer Assessment - Initial Assessment Questions 1. LOCATION: Where does it hurt?      Middle of head, stabbing poking pain  2. ONSET: When did the headache start? (Minutes, hours or days)      08/05/23 3. PATTERN: Does the pain come and go, or has it been constant since it started?     Constant  4. SEVERITY: How bad is the pain? and What does it keep you from doing?  (e.g., Scale 1-10; mild, moderate, or severe)   - MILD (1-3): doesn't interfere with normal activities    - MODERATE (4-7): interferes with normal activities or awakens from sleep     - SEVERE (8-10): excruciating pain, unable to do any normal activities        9/10 5. RECURRENT SYMPTOM: Have you ever had headaches before? If Yes, ask: When was the last time? and What happened that time?      No 6. CAUSE: What do you think is causing the headache?     Head injury 08/05/23 7. MIGRAINE: Have you been diagnosed with migraine headaches? If Yes, ask: Is this headache similar?      No 8. HEAD INJURY: Has there been any recent injury to the head?      Yes 9. OTHER SYMPTOMS: Do you have any other symptoms? (fever, stiff neck, eye pain, sore throat, cold symptoms)     Dizziness, neck pain,  Protocols used: Headache-A-AH, Concussion (mTBI) Less Than 14 Days Ago Follow-up Call-A-AH

## 2023-08-11 NOTE — Discharge Instructions (Addendum)
 Shared medical decision making reguarding CT head.  They were sent in by PCP for serial CT head, however he had initial imaging on the day of the injury that was negative.  I was able to view those results in epic.  Additionally, he has no risk factors for delayed bleeding.  (Is not on Plavix has no history of complex bleeding disorder, is under age 43).  His symptoms including the dizziness, visual symptoms and headaches are much more consistent with postconcussive disorder.  If anything, he may need outpatient MRI if his PCP or neurology thinks this is appropriate but I cannot see any acute indication for repeat need for CT at this time especially given the timeline and nature of his symptoms.

## 2023-08-11 NOTE — ED Notes (Signed)
 Initial contact made. Pt is resting in bed. Pt states he had an injury at work on December 27th where he hit his head on an overhead object and was knocked unconscious. Pt states that he has persistent blurry vision, worsening headaches and neck pain, right elbow pain that travels down the arm with numbness in the right fingertips

## 2023-08-11 NOTE — Telephone Encounter (Signed)
 Agree. Patient needs evaluation. Since he is in the ED, he can undergo evaluation by one of their providers.

## 2023-08-11 NOTE — Telephone Encounter (Signed)
 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 Patient is requesting provider here to order scan so that he does not have to wait in ED. Advised patient we cannot order imaging since he has not had an evaluation with one of the providers in our clinic. Advised patient he needed to wait at ED to get scan done. Offered patient f/u appt with Mallie after ED visit, patient said okay and hung up the phone.

## 2023-08-11 NOTE — ED Triage Notes (Addendum)
 Seen 12/31 for head injury 12/27. DC with concussion. Still complains of neck pain, tingling in fingers, persistent headache. Denies lack of coordination/balance, no loss of bowel/bladder. CT head and C spine done on 27 th.

## 2023-08-11 NOTE — Telephone Encounter (Signed)
 Noted see patient has an active ED encounter

## 2023-08-12 NOTE — Telephone Encounter (Signed)
Reviewed ED notes

## 2023-08-18 ENCOUNTER — Ambulatory Visit: Payer: Self-pay | Admitting: Primary Care

## 2023-08-18 ENCOUNTER — Inpatient Hospital Stay: Payer: 59 | Admitting: Primary Care

## 2023-08-18 NOTE — Telephone Encounter (Signed)
 Called and spoke with patient, offered earlier appt with Dr. Watt. Advised patient he does not work on Fridays as patient was requesting. Patient stated he will need transportation to the appt so he will need to check and give the office a callback to see about an earlier appt

## 2023-08-18 NOTE — Telephone Encounter (Signed)
 Copied from CRM [id number]. Topic: Clinical - Pink Word Triage >> Aug 18, 2023  3:51 PM Melissa C wrote: Reason for Triage: patient stated that he has an appointment February 6th for knee injection, wants to get in sooner, however don't see anything sooner. Stated the pain in his knee has worsened since making the appointment.  Chief Complaint: Right knee pain Symptoms: Pain Frequency: Ongoing Pertinent Negatives: Patient denies relief Disposition: [] ED /[] Urgent Care (no appt availability in office) / [x] Appointment(In office/virtual)/ []  [facility]/ [] Home Care/ [x] Refused Recommended Disposition /[] [facility]/ []  Follow-up with PCP Additional Notes: Patient called in reporting severe pain in his right knee. Patient stated that he slipped on water and fell at work in October and his knee has been hurting since the fall. Patient described the pain as sharp and burning. Patient stated he has a limp when he walks. Patient reported that he has chronic arthritis in his right knee and received an injection that helped relieve the pain in August. Patient is seeking an appointment to receive another injection. Patient is taking Motrin  for the pain, but denies relief. Patient also reported that is experiencing sensitivity to light and he believes it's due to Fioricet. This RN offered an appointment with Dr. Watt on Monday, 1/13. Patient refused and stated that he needs an appointment on a Friday. Patient is requesting Friday, 1/17. This RN advised patient that I would route the conversation to the clinic to see if they would accommodate. This RN advised patient to call back if symptoms worsen in the meantime.   Reason for Disposition  Knee pain is a chronic symptom (recurrent or ongoing AND present > 4 weeks)  Answer Assessment - Initial Assessment Questions 1. LOCATION and RADIATION: Where is the pain located?      Right knee  2. QUALITY: What does the pain feel like?  (e.g.,  sharp, dull, aching, burning)     Patient states pain feels sharp and burning  3. SEVERITY: How bad is the pain? What does it keep you from doing?   (Scale 1-10; or mild, moderate, severe)   -  MILD (1-3): doesn't interfere with normal activities    -  MODERATE (4-7): interferes with normal activities (e.g., work or school) or awakens from sleep, limping    -  SEVERE (8-10): excruciating pain, unable to do any normal activities, unable to walk     Patient rates pain as a 6 to 10, depending time of day and states he has to limp to walk  4. ONSET: When did the pain start? Does it come and go, or is it there all the time?     Patient states injury at work occurred 10/27  5. RECURRENT: Have you had this pain before? If Yes, ask: When, and what happened then?     Patient has a history of arthritis and problems with his right knee  6. SETTING: Has there been any recent work, exercise or other activity that involved that part of the body?      Patient states he slid on water and fell at work  7. AGGRAVATING FACTORS: What makes the knee pain worse? (e.g., walking, climbing stairs, running)     Patient states climbing stares and carrying something makes pain worse  8. ASSOCIATED SYMPTOMS: Is there any swelling or redness of the knee?     Light swelling  Protocols used: Knee Pain-A-AH
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
## 2023-08-29 ENCOUNTER — Ambulatory Visit (INDEPENDENT_AMBULATORY_CARE_PROVIDER_SITE_OTHER): Payer: Worker's Compensation | Admitting: Family Medicine

## 2023-08-29 ENCOUNTER — Ambulatory Visit (INDEPENDENT_AMBULATORY_CARE_PROVIDER_SITE_OTHER)
Admission: RE | Admit: 2023-08-29 | Discharge: 2023-08-29 | Disposition: A | Discharge status: Home / Self Care | Payer: 59 | Source: Ambulatory Visit | Attending: Family Medicine | Admitting: Family Medicine

## 2023-08-29 ENCOUNTER — Encounter: Payer: Self-pay | Admitting: Family Medicine

## 2023-08-29 VITALS — BP 108/76 | HR 96 | Temp 97.1°F | Ht 72.0 in | Wt 229.1 lb

## 2023-08-29 DIAGNOSIS — M170 Bilateral primary osteoarthritis of knee: Secondary | ICD-10-CM

## 2023-08-29 DIAGNOSIS — M25561 Pain in right knee: Secondary | ICD-10-CM | POA: Diagnosis not present

## 2023-08-29 DIAGNOSIS — M1711 Unilateral primary osteoarthritis, right knee: Secondary | ICD-10-CM | POA: Diagnosis not present

## 2023-08-29 MED ORDER — TRIAMCINOLONE ACETONIDE 40 MG/ML IJ SUSP
40.0000 mg | Freq: Once | INTRAMUSCULAR | Status: AC
  Administered 2023-08-29: 40 mg via INTRA_ARTICULAR

## 2023-08-29 MED ORDER — IBUPROFEN 800 MG PO TABS: 800.0000 mg | ORAL_TABLET | Freq: Three times a day (TID) | ORAL | 1 refills | Status: DC | PRN

## 2023-08-29 MED ORDER — ONDANSETRON 4 MG PO TBDP: 4.0000 mg | ORAL_TABLET | Freq: Three times a day (TID) | ORAL | 2 refills | Status: AC | PRN

## 2023-08-29 MED ORDER — BUTALBITAL-APAP-CAFFEINE 50-325-40 MG PO TABS: 1.0000 | ORAL_TABLET | Freq: Four times a day (QID) | ORAL | 0 refills | Status: DC | PRN

## 2023-09-14 ENCOUNTER — Ambulatory Visit: Payer: 59 | Admitting: Family Medicine

## 2023-09-15 ENCOUNTER — Ambulatory Visit: Payer: 59 | Admitting: Family Medicine

## 2023-09-19 ENCOUNTER — Encounter: Payer: Self-pay | Admitting: Family Medicine

## 2023-09-19 ENCOUNTER — Ambulatory Visit (INDEPENDENT_AMBULATORY_CARE_PROVIDER_SITE_OTHER): Payer: 59 | Admitting: Family Medicine

## 2023-09-19 VITALS — Ht 72.0 in | Wt 225.0 lb

## 2023-09-19 DIAGNOSIS — M25561 Pain in right knee: Secondary | ICD-10-CM

## 2023-09-19 DIAGNOSIS — M170 Bilateral primary osteoarthritis of knee: Secondary | ICD-10-CM | POA: Diagnosis not present

## 2023-09-19 DIAGNOSIS — G8929 Other chronic pain: Secondary | ICD-10-CM

## 2023-09-19 DIAGNOSIS — M25562 Pain in left knee: Secondary | ICD-10-CM
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
## 2023-09-19 NOTE — Patient Instructions (Signed)
 Thank you for coming in today.  We will check insurance coverage for gel shots for your knees.   Check with your insurance to see if Dr. Alease Hunter can see you for your concussion symptoms.   Follow up with us  as needed

## 2023-09-20 ENCOUNTER — Other Ambulatory Visit: Payer: Self-pay | Admitting: Primary Care

## 2023-09-23 NOTE — Telephone Encounter (Signed)
Patient picked up forms at front desk.

## 2023-10-10 ENCOUNTER — Telehealth: Payer: Self-pay

## 2023-10-10 NOTE — Telephone Encounter (Signed)
 Please schedule patient once medication is in stock. Thank you   ORTHOVISC authorized for bilateral knee Deductible does not apply Once the OOP is met patient is covered at 100% Copay $10 OOP max $2000 met $15.59 Product copay 25% Administration copay 25% PA authorized # [id number]  EXP: 10/03/24

## 2023-10-11 ENCOUNTER — Telehealth: Payer: Self-pay | Admitting: Family Medicine

## 2023-10-11 NOTE — Telephone Encounter (Signed)
 Left message for patient to call back to schedule. *Please confirm stock before scheduling*

## 2023-10-11 NOTE — Telephone Encounter (Signed)
 Patient wants to make sure that the injection is approved or covered by workers comp before scheduling. He asked if we could call back today.

## 2023-10-12 NOTE — Telephone Encounter (Signed)
 Patient called back to follow up. Advised that we do not bill third party.

## 2023-11-21 ENCOUNTER — Telehealth: Payer: Self-pay

## 2023-11-21 NOTE — Telephone Encounter (Signed)
 Copied from CRM [id number]. Topic: General - Other >> Nov 21, 2023 12:08 PM Howard Macho wrote: Reason for CRM: patient called stating he saw [facility] and he recommended supplements to take. Patient stated he need [facility] to write the supplements as a prescription so it can be covered by workers comp. Patient stated he want it sent to  [facility]

## 2023-11-28 MED ORDER — NONFORMULARY OR COMPOUNDED ITEM: 1 refills | Status: DC

## 2023-11-28 NOTE — Telephone Encounter (Signed)
 Prescriptions for Tart Cherry and Curcumin faxed to [facility] in [place] at [phone].

## 2023-11-28 NOTE — Telephone Encounter (Signed)
 I printed a couple of prescriptions for him.

## 2023-11-28 NOTE — Addendum Note (Signed)
 Addended by: Scherrie Curt on: 11/28/2023 01:43 PM   Modules accepted: Orders

## 2023-12-08 ENCOUNTER — Telehealth: Payer: Self-pay

## 2023-12-08 NOTE — Telephone Encounter (Signed)
 Spoke with patient verified this is need. Faxed notes from 08/29/23 as requested. Patient did not have visit in Feb. That was noted on fax to [facility].

## 2023-12-08 NOTE — Telephone Encounter (Signed)
 Copied from CRM [id number]. Topic: General - Other >> Dec 08, 2023 12:11 PM Ovid Blow wrote: Reason for CRM: Jimmye Moulds from [facility] stated that he received paperwork of office visit from Jan. 2025 but not Feb. 2025. Jimmye Moulds needs copy of the office visit summary and notes. [phone] ext. [phone] Fax: [phone] **Attorney needs paperwork**

## 2023-12-13 NOTE — Telephone Encounter (Signed)
 Printed form will put in your folder for review.

## 2023-12-15 ENCOUNTER — Telehealth (INDEPENDENT_AMBULATORY_CARE_PROVIDER_SITE_OTHER): Payer: Self-pay | Admitting: Primary Care

## 2023-12-15 ENCOUNTER — Encounter: Payer: Self-pay | Admitting: Primary Care

## 2023-12-15 DIAGNOSIS — S14101S Unspecified injury at C1 level of cervical spinal cord, sequela: Secondary | ICD-10-CM

## 2023-12-15 DIAGNOSIS — G44321 Chronic post-traumatic headache, intractable: Secondary | ICD-10-CM

## 2023-12-15 DIAGNOSIS — M170 Bilateral primary osteoarthritis of knee: Secondary | ICD-10-CM

## 2023-12-15 DIAGNOSIS — G44301 Post-traumatic headache, unspecified, intractable: Secondary | ICD-10-CM | POA: Insufficient documentation

## 2023-12-15 NOTE — Assessment & Plan Note (Signed)
 Chronic, ongoing.  Continue to follow with sports medicine/orthopedics. Continue injections.

## 2023-12-15 NOTE — Assessment & Plan Note (Signed)
 Chronic, ongoing. Reviewed CT C-spine and CT head from December 2024 through Care Everywhere.  Agree to complete disability paperwork per patient request. See HPI for complete details.

## 2023-12-15 NOTE — Patient Instructions (Signed)
 We will be in touch once we complete your paperwork.  It was a pleasure to see you today!
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
## 2023-12-18 NOTE — Progress Notes (Unsigned)
     Francis Herman T. Jerrald Doverspike, MD, CAQ Sports Medicine [facility] at [facility] [address] [place], [address]  Phone: [phone]  FAX: [phone]  Francis Herman - 43 y.o. male  MRN [id number]  Date of Birth: [birth year]  Date: 12/21/2023  PCP: Gabriel John, NP  Referral: Gabriel John, NP  No chief complaint on file.  Subjective:   Francis Herman is a 43 y.o. very pleasant male patient with There is no height or weight on file to calculate BMI. who presents with the following:  Patient presents with some ongoing right-sided knee pain as a Worker's Comp. claim.  Patient's radiographs from January 2025 are reviewed, and he shows some severe, bone-on-bone pathology of the knee tricompartmentally, quite severe for age, but severe in general.  Patient previously saw me for knee pain, he subsequently saw Dr. Alease Hunter, and it looks like they had arranged for Orthovisc.    Review of Systems is noted in the HPI, as appropriate  Objective:   There were no vitals taken for this visit.  GEN: No acute distress; alert,appropriate. PULM: Breathing comfortably in no respiratory distress PSYCH: Normally interactive.   Laboratory and Imaging Data:  Assessment and Plan:   ***

## 2023-12-21 ENCOUNTER — Ambulatory Visit (INDEPENDENT_AMBULATORY_CARE_PROVIDER_SITE_OTHER): Payer: Worker's Compensation | Admitting: Family Medicine

## 2023-12-21 ENCOUNTER — Encounter: Payer: Self-pay | Admitting: Family Medicine

## 2023-12-21 VITALS — BP 104/76 | HR 60 | Temp 98.0°F | Ht 72.0 in | Wt 215.1 lb

## 2023-12-21 DIAGNOSIS — M25561 Pain in right knee: Secondary | ICD-10-CM

## 2023-12-21 DIAGNOSIS — M170 Bilateral primary osteoarthritis of knee: Secondary | ICD-10-CM | POA: Diagnosis not present

## 2023-12-21 DIAGNOSIS — M1711 Unilateral primary osteoarthritis, right knee: Secondary | ICD-10-CM | POA: Diagnosis not present

## 2023-12-21 DIAGNOSIS — M25562 Pain in left knee: Secondary | ICD-10-CM | POA: Diagnosis not present

## 2023-12-21 DIAGNOSIS — G8929 Other chronic pain: Secondary | ICD-10-CM | POA: Diagnosis not present

## 2023-12-21 MED ORDER — TRIAMCINOLONE ACETONIDE 40 MG/ML IJ SUSP
40.0000 mg | Freq: Once | INTRAMUSCULAR | Status: AC
  Administered 2023-12-21: 40 mg via INTRA_ARTICULAR

## 2023-12-23 ENCOUNTER — Telehealth: Payer: Self-pay

## 2023-12-23 NOTE — Telephone Encounter (Signed)
 Copied from CRM [id number]. Topic: General - Other >> Dec 23, 2023  1:20 PM Trula Gable C wrote: Reason for CRM: Patient called in regarding [name] completing the forms that he needs for approval

## 2023-12-23 NOTE — Telephone Encounter (Signed)
 Called and spoke with patient, he states his attorney has advised him that there are some parts of the disability form that need to be amended.   He gives the following reason: Because of the work related Injury on 06/05/23 to the right knee this escalated the inflammation within the knee thus worsening his chronic osteoarthritis. Because of the injury his condition has become chronic and worse. States that prior to the injury his chronic condition was manageable with conservative measures, since the injury this has caused the need for additional treatment.   He listed the below areas on the form that need to be amended:  #2. Diagnosis- Include that the injury that occurred on 06/05/23 at work has caused his right knee pain to worsen as well as make his osteoarthritis a chronic condition.  #18. Under reason for conclusion: Instability due to chronic knee pain that has been escalated due to work related injury and osteoarthritis. #21. Include date of injury  Disability form placed back in your box for review.

## 2023-12-26 NOTE — Telephone Encounter (Signed)
 Forms have been received and placed in [facility] inbox. Disregard this message chain as there is a more detailed encounter of patients needs in separate MyChart message.

## 2023-12-27 DIAGNOSIS — Z789 Other specified health status: Secondary | ICD-10-CM | POA: Insufficient documentation

## 2023-12-27 DIAGNOSIS — M899 Disorder of bone, unspecified: Secondary | ICD-10-CM | POA: Insufficient documentation

## 2023-12-27 DIAGNOSIS — G894 Chronic pain syndrome: Secondary | ICD-10-CM | POA: Insufficient documentation

## 2023-12-27 DIAGNOSIS — Z79899 Other long term (current) drug therapy: Secondary | ICD-10-CM | POA: Insufficient documentation
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
## 2023-12-27 NOTE — Patient Instructions (Signed)

## 2023-12-28 ENCOUNTER — Encounter: Payer: Self-pay | Admitting: Pain Medicine

## 2023-12-28 ENCOUNTER — Ambulatory Visit: Payer: Worker's Compensation | Attending: Pain Medicine | Admitting: Pain Medicine

## 2023-12-28 ENCOUNTER — Telehealth: Payer: Self-pay | Admitting: Primary Care

## 2023-12-28 VITALS — BP 126/80 | HR 70 | Temp 97.2°F | Ht 75.0 in | Wt 210.0 lb

## 2023-12-28 DIAGNOSIS — G4486 Cervicogenic headache: Secondary | ICD-10-CM | POA: Diagnosis present

## 2023-12-28 DIAGNOSIS — M542 Cervicalgia: Secondary | ICD-10-CM

## 2023-12-28 DIAGNOSIS — R519 Headache, unspecified: Secondary | ICD-10-CM | POA: Insufficient documentation

## 2023-12-28 DIAGNOSIS — S83241S Other tear of medial meniscus, current injury, right knee, sequela: Secondary | ICD-10-CM

## 2023-12-28 DIAGNOSIS — R937 Abnormal findings on diagnostic imaging of other parts of musculoskeletal system: Secondary | ICD-10-CM

## 2023-12-28 DIAGNOSIS — M4312 Spondylolisthesis, cervical region: Secondary | ICD-10-CM | POA: Diagnosis present

## 2023-12-28 DIAGNOSIS — M47812 Spondylosis without myelopathy or radiculopathy, cervical region: Secondary | ICD-10-CM | POA: Diagnosis present

## 2023-12-28 DIAGNOSIS — G894 Chronic pain syndrome: Secondary | ICD-10-CM | POA: Diagnosis present

## 2023-12-28 DIAGNOSIS — G8929 Other chronic pain: Secondary | ICD-10-CM

## 2023-12-28 DIAGNOSIS — Z79899 Other long term (current) drug therapy: Secondary | ICD-10-CM | POA: Diagnosis present

## 2023-12-28 DIAGNOSIS — M25521 Pain in right elbow: Secondary | ICD-10-CM

## 2023-12-28 DIAGNOSIS — M5412 Radiculopathy, cervical region: Secondary | ICD-10-CM

## 2023-12-28 DIAGNOSIS — M1711 Unilateral primary osteoarthritis, right knee: Secondary | ICD-10-CM

## 2023-12-28 DIAGNOSIS — M79601 Pain in right arm: Secondary | ICD-10-CM

## 2023-12-28 DIAGNOSIS — M7051 Other bursitis of knee, right knee: Secondary | ICD-10-CM | POA: Diagnosis present

## 2023-12-28 DIAGNOSIS — M899 Disorder of bone, unspecified: Secondary | ICD-10-CM

## 2023-12-28 DIAGNOSIS — M25561 Pain in right knee: Secondary | ICD-10-CM

## 2023-12-28 DIAGNOSIS — Z789 Other specified health status: Secondary | ICD-10-CM | POA: Diagnosis present

## 2023-12-28 DIAGNOSIS — M4722 Other spondylosis with radiculopathy, cervical region: Secondary | ICD-10-CM

## 2023-12-28 DIAGNOSIS — Z026 Encounter for examination for insurance purposes: Secondary | ICD-10-CM | POA: Insufficient documentation

## 2023-12-28 NOTE — Telephone Encounter (Signed)
 Copied from CRM [id number]. Topic: Medical Record Request - Records Request >> Nov 18, 2023 10:14 AM [employer] wrote: Reason for CRM: Kita Perish (claims adjuster) from [place] is requesting patient records for knee claim. He would like the records from 08/29/23 and 09/19/23.   Fax #: [phone] Callback #: [phone] ext [phone]

## 2023-12-28 NOTE — Telephone Encounter (Signed)
 Left voicemail advising Francis Herman with [employer] that a signed release of information form needs to be faxed to medical records department to authorize us  to send [facility] records.

## 2023-12-28 NOTE — Progress Notes (Signed)
 Safety precautions to be maintained throughout the outpatient stay will include: orient to surroundings, keep bed in low position, maintain call bell within reach at all times, provide assistance with transfer out of bed and ambulation.

## 2023-12-30 NOTE — Telephone Encounter (Signed)
 I did not know this existed as there is no office visit date of 06/05/2023.  Regardless, injury date added as requested.  Forms are ready for faxing.
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
## 2023-12-30 NOTE — Telephone Encounter (Signed)
 Patient notified. Forms faxed. Copy placed up front for patient to pickup
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
## 2023-12-31 LAB — COMPLIANCE DRUG ANALYSIS, UR

## 2024-01-04 LAB — COMP. METABOLIC PANEL (12)
AST: 16 IU/L (ref 0–40)
Albumin: 4.3 g/dL (ref 4.1–5.1)
Alkaline Phosphatase: 61 IU/L (ref 44–121)
BUN/Creatinine Ratio: 17 (ref 9–20)
BUN: 17 mg/dL (ref 6–24)
Bilirubin Total: 0.3 mg/dL (ref 0.0–1.2)
Calcium: 9.3 mg/dL (ref 8.7–10.2)
Chloride: 103 mmol/L (ref 96–106)
Creatinine, Ser: 1 mg/dL (ref 0.76–1.27)
Globulin, Total: 2.5 g/dL (ref 1.5–4.5)
Glucose: 88 mg/dL (ref 70–99)
Potassium: 5.5 mmol/L — ABNORMAL HIGH (ref 3.5–5.2)
Sodium: 138 mmol/L (ref 134–144)
Total Protein: 6.8 g/dL (ref 6.0–8.5)
eGFR: 96 mL/min/{1.73_m2} (ref >59)

## 2024-01-04 LAB — 25-HYDROXY VITAMIN D LCMS D2+D3
25-Hydroxy, Vitamin D-3: 62 ng/mL
25-Hydroxy, Vitamin D: 62 ng/mL

## 2024-01-04 LAB — SEDIMENTATION RATE: Sed Rate: 4 mm/h (ref 0–15)

## 2024-01-04 LAB — MAGNESIUM: Magnesium: 2.3 mg/dL (ref 1.6–2.3)

## 2024-01-04 LAB — VITAMIN B12: Vitamin B-12: 516 pg/mL (ref 232–1245)

## 2024-01-04 LAB — C-REACTIVE PROTEIN: CRP: 1 mg/L (ref 0–10)

## 2024-01-18 ENCOUNTER — Telehealth: Payer: Self-pay | Admitting: Primary Care

## 2024-01-18 NOTE — Telephone Encounter (Signed)
 OV note from sports medicine [facility] faxed as requested.

## 2024-01-18 NOTE — Telephone Encounter (Signed)
 Copied from CRM [id number]. Topic: Medical Record Request - Records Request >> Jan 18, 2024  8:55 AM Deaijah H wrote: Reason for CRM: Jimmye Moulds w/ [employer] called in to get med records faxed from 09/19/23. Has requested them a few times but never received. Fax # [phone].

## 2024-02-06 ENCOUNTER — Telehealth: Payer: Self-pay

## 2024-02-06 NOTE — Telephone Encounter (Signed)
 I printed the forms and put in your basket; shred if not okay filling out. Does she also need any appt for this or okay to do without?

## 2024-02-06 NOTE — Telephone Encounter (Signed)
 Copied from CRM [id number]. Topic: Clinical - Medical Advice >> Feb 06, 2024  1:25 PM Shereese L wrote: Reason for CRM: patient would like a call back in reference to prior auth

## 2024-02-07 NOTE — Telephone Encounter (Signed)
 Called patient this was not about a authorization. It was about forms that he sent message about yesterday. This has been sent to provider for review. This is a duplicate.  No further action needed at this time.

## 2024-02-08 ENCOUNTER — Other Ambulatory Visit: Payer: Self-pay | Admitting: Primary Care

## 2024-02-08 DIAGNOSIS — R7303 Prediabetes: Secondary | ICD-10-CM

## 2024-02-09 ENCOUNTER — Encounter: Payer: Self-pay | Admitting: Pain Medicine

## 2024-02-19 NOTE — Progress Notes (Unsigned)
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 K, NP  Duration of encounter: *** minutes.  Total time on encounter, as per AMA guidelines included both the face-to-face and non-face-to-face time personally spent by the physician and/or other qualified health care professional(s) on the day of the encounter (includes time in activities that require the physician or other qualified health care professional and does not include time in activities normally performed  by clinical staff). Physician's time may include the following activities when performed: Preparing to see the patient (e.g., pre-charting review of  records, searching for previously ordered imaging, lab work, and nerve conduction tests) Review of prior analgesic pharmacotherapies. Reviewing PMP Interpreting ordered tests (e.g., lab work, imaging, nerve conduction tests) Performing post-procedure evaluations, including interpretation of diagnostic procedures Obtaining and/or reviewing separately obtained history Performing a medically appropriate examination and/or evaluation Counseling and educating the patient/family/caregiver Ordering medications, tests, or procedures Referring and communicating with other health care professionals (when not separately reported) Documenting clinical information in the electronic or other health record Independently interpreting results (not separately reported) and communicating results to the patient/ family/caregiver Care coordination (not separately reported)  Note by: Eric DELENA Como, MD (TTS technology used. I apologize for any typographical errors that were not detected and corrected.) Date: 02/20/2024; Time: 7:01 PM

## 2024-02-20 ENCOUNTER — Ambulatory Visit: Payer: Worker's Compensation | Attending: Pain Medicine | Admitting: Pain Medicine

## 2024-02-20 ENCOUNTER — Encounter: Payer: Self-pay | Admitting: Pain Medicine

## 2024-02-20 VITALS — BP 124/79 | HR 58 | Temp 79.5°F | Resp 18 | Ht 75.0 in | Wt 209.0 lb

## 2024-02-20 DIAGNOSIS — G4486 Cervicogenic headache: Secondary | ICD-10-CM

## 2024-02-20 DIAGNOSIS — M542 Cervicalgia: Secondary | ICD-10-CM

## 2024-02-20 DIAGNOSIS — M47812 Spondylosis without myelopathy or radiculopathy, cervical region: Secondary | ICD-10-CM

## 2024-02-20 DIAGNOSIS — M4312 Spondylolisthesis, cervical region: Secondary | ICD-10-CM | POA: Diagnosis present

## 2024-02-20 DIAGNOSIS — G8929 Other chronic pain: Secondary | ICD-10-CM | POA: Diagnosis present

## 2024-02-20 DIAGNOSIS — R519 Headache, unspecified: Secondary | ICD-10-CM

## 2024-02-20 DIAGNOSIS — M5412 Radiculopathy, cervical region: Secondary | ICD-10-CM

## 2024-02-20 NOTE — Progress Notes (Signed)
 Safety precautions to be maintained throughout the outpatient stay will include: orient to surroundings, keep bed in low position, maintain call bell within reach at all times, provide assistance with transfer out of bed and ambulation.

## 2024-02-24 ENCOUNTER — Other Ambulatory Visit

## 2024-03-01 ENCOUNTER — Telehealth: Payer: Self-pay

## 2024-03-01 NOTE — Telephone Encounter (Signed)
 Called and advised Francis Herman with [facility] that we havent received any  ppw to sign.  She will refax this to the clinic. Stated it needs to be signed by Dr. Watt since Pt saw him for sports medicine.

## 2024-03-01 NOTE — Telephone Encounter (Signed)
 Copied from CRM #[id number]. Topic: General - Other >> Feb 29, 2024  4:31 PM Aisha D wrote: Reason for CRM: Dawuan with [facility] faxed over the pt's recent progress notes and stated it needs to be signed.Dawuan would like a callback with an update of this concern. Callback number is [phone].

## 2024-03-02 ENCOUNTER — Other Ambulatory Visit

## 2024-03-02 ENCOUNTER — Ambulatory Visit (INDEPENDENT_AMBULATORY_CARE_PROVIDER_SITE_OTHER): Payer: 59 | Admitting: Primary Care

## 2024-03-02 ENCOUNTER — Encounter: Payer: Self-pay | Admitting: Primary Care

## 2024-03-02 VITALS — BP 150/86 | HR 57 | Temp 98.0°F | Ht 75.0 in | Wt 206.0 lb

## 2024-03-02 DIAGNOSIS — Z0000 Encounter for general adult medical examination without abnormal findings: Secondary | ICD-10-CM

## 2024-03-02 DIAGNOSIS — M542 Cervicalgia: Secondary | ICD-10-CM | POA: Diagnosis not present

## 2024-03-02 DIAGNOSIS — G44321 Chronic post-traumatic headache, intractable: Secondary | ICD-10-CM | POA: Diagnosis not present

## 2024-03-02 DIAGNOSIS — R5382 Chronic fatigue, unspecified: Secondary | ICD-10-CM | POA: Insufficient documentation

## 2024-03-02 DIAGNOSIS — M170 Bilateral primary osteoarthritis of knee: Secondary | ICD-10-CM | POA: Diagnosis not present

## 2024-03-02 DIAGNOSIS — G8929 Other chronic pain: Secondary | ICD-10-CM

## 2024-03-02 NOTE — Assessment & Plan Note (Signed)
 Following with pain specialist.   He is contemplating cervical spine ablation.

## 2024-03-02 NOTE — Assessment & Plan Note (Signed)
 The patient sedentary lifestyle.  He is also managed on 100 mg of amitriptyline.  Checking labs including testosterone, TSH, A1c. Labs from May 2025 reviewed.  Recommended to increase physical activity as tolerated.

## 2024-03-02 NOTE — Telephone Encounter (Signed)
Addressed at OV

## 2024-03-02 NOTE — Progress Notes (Signed)
 Subjective:    Patient ID: Francis Herman, male    DOB: [birth year], 43 y.o.   MRN: [id number]  HPI  Francis Herman is a very pleasant 43 y.o. male who presents today for complete physical and follow up of chronic conditions.  He is is requesting a renewal of his handicap placard due to his chronic right knee pain and arthritis. He will experience weakness and pain with prolonged walking.   He would like his testosterone checked  because he feels tired everyday. He wakes up feeling tired, doesn't sleep well due to his headaches. He's undergone a sleep study, was diagnosed with mild sleep apnea, was told to lose weight for which he did. He denies snoring, decreased libido, erectile dysfunction.   Immunizations: -Tetanus: Completed in 2016  Diet: Fair diet.  Exercise: No regular exercise.  Eye exam: Completed years ago   Dental exam: Completes semi-annually     BP Readings from Last 3 Encounters:  03/02/24 (!) 152/84  02/20/24 124/79  12/28/23 126/80       Review of Systems  Constitutional:  Positive for fatigue. Negative for unexpected weight change.  HENT:  Negative for rhinorrhea.   Respiratory:  Negative for cough and shortness of breath.   Cardiovascular:  Negative for chest pain.  Gastrointestinal:  Negative for constipation and diarrhea.  Genitourinary:  Negative for difficulty urinating.  Musculoskeletal:  Positive for arthralgias.  Skin:  Negative for rash.  Allergic/Immunologic: Negative for environmental allergies.  Neurological:  Positive for headaches. Negative for dizziness.  Psychiatric/Behavioral:  The patient is not nervous/anxious.          Past Medical History:  Diagnosis Date   Alcohol abuse    Grief 03/02/2023   High cholesterol    Primary localized osteoarthritis of right knee    Wears glasses     Social History   Socioeconomic History   Marital status: Single    Spouse name: Not on file   Number of children: Not on file   Years of  education: Not on file   Highest education level: Bachelor's degree (e.g., BA, AB, BS)  Occupational History   Not on file  Tobacco Use   Smoking status: Never   Smokeless tobacco: Never  Substance and Sexual Activity   Alcohol use: No    Alcohol/week: 3.0 standard drinks of alcohol    Types: 3 Standard drinks or equivalent per week    Comment: socially   Drug use: No   Sexual activity: Not on file  Other Topics Concern   Not on file  Social History Narrative   Divorced; from [place]; [place]  since 2000      Children: 2 children      Employment: Works for [employer].       Tobacco: none      Exercise: biking   Enjoys spending time with his children, watching sports.       Social Drivers of Health   Financial Resource Strain: Medium Risk (02/27/2024)   Overall Financial Resource Strain (CARDIA)    Difficulty of Paying Living Expenses: Somewhat hard  Food Insecurity: Food Insecurity Present (02/27/2024)   Hunger Vital Sign    Worried About Running Out of Food in the Last Year: Sometimes true    Ran Out of Food in the Last Year: Sometimes true  Transportation Needs: Unmet Transportation Needs (02/27/2024)   PRAPARE - Administrator, Civil Service (Medical): Yes    Lack of Transportation (Non-Medical): Yes  Physical Activity: Inactive (02/27/2024)   Exercise Vital Sign    Days of Exercise per Week: 0 days    Minutes of Exercise per Session: Not on file  Stress: Stress Concern Present (02/27/2024)   [employer] of Occupational Health - Occupational Stress Questionnaire    Feeling of Stress: Very much  Social Connections: Moderately Isolated (02/27/2024)   Social Connection and Isolation Panel    Frequency of Communication with Friends and Family: Once a week    Frequency of Social Gatherings with Friends and Family: Never    Attends Religious Services: More than 4 times per year    Active Member of [employer] or Organizations: Yes    Attends Banker  Meetings: More than 4 times per year    Marital Status: Divorced  Intimate Partner Violence: Patient Declined (11/17/2023)   Received from [facility]   HITS    Over the last 12 months how often did your partner physically hurt you?: Patient declined    Over the last 12 months how often did your partner insult you or talk down to you?: Patient declined    Over the last 12 months how often did your partner threaten you with physical harm?: Patient declined    Over the last 12 months how often did your partner scream or curse at you?: Patient declined    Past Surgical History:  Procedure Laterality Date   KNEE ARTHROSCOPY WITH MEDIAL MENISECTOMY Right 06/05/2013   Procedure:  Right Knee Arthroscopy with Medial Meniscectomy Chondroplasty and Micro Fracture;  Surgeon: Lamar Collet, MD;  Location: [facility];  Service: Orthopedics;  Laterality: Right;   KNEE ARTHROSCOPY WITH MENISCAL REPAIR Left 2009    Family History  Problem Relation Age of Onset   Arthritis Maternal Grandmother    Hypertension Maternal Grandfather    Parkinsonism Father    Hyperlipidemia Father    Syncope episode Father    Arthritis Father     Allergies  Allergen Reactions   Piritramide Itching    Current Outpatient Medications on File Prior to Visit  Medication Sig Dispense Refill   amitriptyline (ELAVIL) 25 MG tablet Take 75 mg by mouth at bedtime.     co-enzyme Q-10 30 MG capsule Take 30 mg by mouth daily.     diclofenac  Sodium (VOLTAREN ) 1 % GEL Apply 2 g topically 3 (three) times daily as needed (pain). 100 g 0   magnesium oxide (MAG-OX) 400 MG tablet Take 400 mg by mouth at bedtime.     meloxicam  (MOBIC ) 15 MG tablet Take 15 mg by mouth daily.     NONFORMULARY OR COMPOUNDED ITEM Curcumin 1,000 mg po once a day 90 each 1   NONFORMULARY OR COMPOUNDED ITEM Tart cherry juice concentrate capsules 1 capsule po BID 180 each 1   ondansetron  (ZOFRAN -ODT) 4 MG disintegrating tablet Take 1  tablet (4 mg total) by mouth every 8 (eight) hours as needed for nausea or vomiting. 20 tablet 2   polyethylene glycol (MIRALAX / GLYCOLAX) 17 g packet Take 17 g by mouth daily.     Riboflavin 400 MG CAPS Take 400 mg by mouth every morning.     senna (SENOKOT) 8.6 MG tablet Take 1 tablet by mouth daily.     tiZANidine (ZANAFLEX) 4 MG capsule Take 4 mg by mouth every 6 (six) hours as needed for muscle spasms.     No current facility-administered medications on file prior to visit.    BP (!) 152/84  Pulse (!) 57   Temp 98 F (36.7 C) (Temporal)   Ht 6' 3 (1.905 m)   Wt 206 lb (93.4 kg)   SpO2 99%   BMI 25.75 kg/m  Objective:   Physical Exam HENT:     Right Ear: Tympanic membrane and ear canal normal.     Left Ear: Tympanic membrane and ear canal normal.  Eyes:     Pupils: Pupils are equal, round, and reactive to light.  Cardiovascular:     Rate and Rhythm: Normal rate and regular rhythm.  Pulmonary:     Effort: Pulmonary effort is normal.     Breath sounds: Normal breath sounds.  Abdominal:     General: Bowel sounds are normal.     Palpations: Abdomen is soft.     Tenderness: There is no abdominal tenderness.  Musculoskeletal:        General: Normal range of motion.     Cervical back: Neck supple.  Skin:    General: Skin is warm and dry.  Neurological:     Mental Status: He is alert and oriented to person, place, and time.     Cranial Nerves: No cranial nerve deficit.     Deep Tendon Reflexes:     Reflex Scores:      Patellar reflexes are 2+ on the right side and 2+ on the left side. Psychiatric:        Mood and Affect: Mood normal.           Assessment & Plan:  Intractable chronic post-traumatic headache Assessment & Plan: Chronic and ongoing, following with neurology, office notes reviewed from July 2025.  Continue amitriptyline 100 mg HS, tizanidine 4 mg PRN.   He is pending a potential cervical spine ablation.    Chronic neck pain (1ry area of  Pain) (Bilateral) (R>L) Assessment & Plan: Following with pain specialist.   He is contemplating cervical spine ablation.    Osteoarthritis of knees (Bilateral) Assessment & Plan: Improved with injections and PT.  Following with Dr. Watt.  Handicap placard renewed.   Chronic fatigue Assessment & Plan: The patient sedentary lifestyle.  He is also managed on 100 mg of amitriptyline.  Checking labs including testosterone, TSH, A1c. Labs from May 2025 reviewed.  Recommended to increase physical activity as tolerated.  Orders: -     Testosterone; Future -     TSH; Future  Preventative health care Assessment & Plan: Immunizations UTD.  Discussed the importance of a healthy diet and regular exercise in order for weight loss, and to reduce the risk of further co-morbidity.  Exam stable. Labs pending.  Follow up in 1 year for repeat physical.          Krysia Zahradnik K Deone Omahoney, NP

## 2024-03-02 NOTE — Patient Instructions (Addendum)
Schedule a lab only appointment for before 10 am.  It was a pleasure to see you today!

## 2024-03-02 NOTE — Assessment & Plan Note (Signed)
Immunizations UTD.  Discussed the importance of a healthy diet and regular exercise in order for weight loss, and to reduce the risk of further co-morbidity.  Exam stable. Labs pending.  Follow up in 1 year for repeat physical.  

## 2024-03-02 NOTE — Assessment & Plan Note (Signed)
 Chronic and ongoing, following with neurology, office notes reviewed from July 2025.  Continue amitriptyline 100 mg HS, tizanidine 4 mg PRN.   He is pending a potential cervical spine ablation.

## 2024-03-02 NOTE — Assessment & Plan Note (Addendum)
 Improved with injections and PT.  Following with Dr. Watt.  Handicap placard renewed.

## 2024-03-05 ENCOUNTER — Telehealth: Payer: Self-pay

## 2024-03-05 NOTE — Telephone Encounter (Signed)
 Burnard,  Have you by chance seen anything come to [facility] [address] from PT?

## 2024-03-05 NOTE — Telephone Encounter (Signed)
 Spoke with Francis Herman and had her refax paperwork to my direct fax.  Forms received and place in Dr. Eleanore office in box.  They are aware Dr. Watt is out of the office until next week.

## 2024-03-05 NOTE — Telephone Encounter (Signed)
 Copied from CRM [id number]. Topic: Clinical - Request for Lab/Test Order >> Mar 05, 2024  9:02 AM Gustabo D wrote: [facility]- Madelaine Is calling to confirm the plan of care was received  Call back [phone]

## 2024-03-06 ENCOUNTER — Other Ambulatory Visit (INDEPENDENT_AMBULATORY_CARE_PROVIDER_SITE_OTHER): Payer: Worker's Compensation

## 2024-03-06 ENCOUNTER — Ambulatory Visit: Payer: Self-pay | Admitting: Primary Care

## 2024-03-06 DIAGNOSIS — R7989 Other specified abnormal findings of blood chemistry: Secondary | ICD-10-CM

## 2024-03-06 DIAGNOSIS — R5382 Chronic fatigue, unspecified: Secondary | ICD-10-CM | POA: Diagnosis not present

## 2024-03-06 DIAGNOSIS — R7303 Prediabetes: Secondary | ICD-10-CM | POA: Diagnosis not present

## 2024-03-06 LAB — LIPID PANEL
Cholesterol: 199 mg/dL (ref 0–200)
HDL: 50.6 mg/dL (ref >39.00)
LDL Cholesterol: 136 mg/dL — ABNORMAL HIGH (ref 0–99)
NonHDL: 148.83
Total CHOL/HDL Ratio: 4
Triglycerides: 65 mg/dL (ref 0.0–149.0)
VLDL: 13 mg/dL (ref 0.0–40.0)

## 2024-03-06 LAB — TSH: TSH: 1.29 u[IU]/mL (ref 0.35–5.50)

## 2024-03-06 LAB — HEMOGLOBIN A1C: Hgb A1c MFr Bld: 5.8 % (ref 4.6–6.5)

## 2024-03-06 LAB — TESTOSTERONE: Testosterone: 307.79 ng/dL (ref 300.00–890.00)

## 2024-03-22 ENCOUNTER — Telehealth: Payer: Self-pay | Admitting: Primary Care

## 2024-03-22 NOTE — Telephone Encounter (Signed)
 Copied from CRM #[id number]. Topic: General - Billing Inquiry >> Mar 22, 2024  3:46 PM Thersia BROCKS wrote: Reason for CRM: Patient called in stated he is still getting a bill even though he has spoken with someone and they have said it has been handled. Patient stated it is saying past due on mychart , would like for someone to follow up with him about this

## 2024-03-27 NOTE — Progress Notes (Unsigned)
 Francis Herman T. Kaleb Sek, MD, CAQ Sports Medicine [facility] at [facility] [address] [place], [address]  Phone: [phone]  FAX: [phone]  Francis Herman - 43 y.o. male  MRN [id number]  Date of Birth: 11-26-1980  Date: 03/29/2024  PCP: Gretta Comer POUR, NP  Referral: Gretta Comer POUR, NP  No chief complaint on file.  Subjective:   Francis Herman is a 43 y.o. very pleasant male patient with There is no height or weight on file to calculate BMI. who presents with the following:  Discussed the use of AI scribe software for clinical note transcription with the patient, who gave verbal consent to proceed.  Presents for follow-up knee pain and arthritis.  I have included some additional history in regards to his musculoskeletal complaints and care from a prior office visit.   History of Present Illness   12/21/2023 Last OV with Francis Schroeder, MD  Patient presents with some ongoing right-sided knee pain as a Worker's Comp. claim.   Patient's radiographs from January 2025 are reviewed, and he shows some severe, bone-on-bone pathology of the knee tricompartmentally, quite severe for age, but severe in general.   On chart review, the patient has seen multiple different orthopedic and sports medicine providers in [place]  for more than 10 years.  He followed with [facility] for an extended period of time, who performed a knee arthroscopy in October 2014, partial medial meniscectomy, chondroplasty and microfracture.  He also saw [facility] orthopedics with Dr. Courtenay and other physicians.  He was also treated by [facility] for a [employer]. injury.   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  He also had a medial meniscal tear at  that time.   Patient previously saw me on March 14, 2023 and August 29, 2023 for knee pain, he subsequently saw Dr. Joane, and it looks like they had arranged for [facility], but he never received.   Oct. 27, 2024 R knee injury.  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  After this injury he had some exacerbation of his knee pain.  He has been using a cane to help his with walking.   Previously did inject his knees with corticosteroid, and he had a good response.   Had been working 6 days a week previously to injury   Had been getting better with conservative treatment, and now he is having less of a response and he has not returned back to full function and he has not returned to work.  Review of Systems is noted in the HPI, as appropriate  Objective:   There were no vitals taken for this visit.  GEN: No acute distress; alert,appropriate. PULM: Breathing comfortably in no respiratory distress PSYCH: Normally interactive.   Physical Exam   Laboratory and Imaging Data:  Results   Assessment and Plan:   No diagnosis found.  Assessment and Plan Assessment & Plan      Medication Management during today's office visit: No orders of the defined types were placed in this encounter.  There are no discontinued medications.  Orders placed today for conditions managed today: No orders of the defined types were  placed in this encounter.   Disposition: No follow-ups on file.  Dragon [employer] One speech-to-text software was used for transcription in this dictation.  Possible transcriptional errors can occur using Animal nutritionist.   Signed,  Francis DASEN. Francis Pence, MD   Outpatient Encounter Medications as of 03/29/2024  Medication Sig   amitriptyline (ELAVIL) 25 MG tablet Take 75 mg by mouth at bedtime.   co-enzyme Q-10 30 MG capsule Take 30  mg by mouth daily.   diclofenac  Sodium (VOLTAREN ) 1 % GEL Apply 2 g topically 3 (three) times daily as needed (pain).   magnesium oxide (MAG-OX) 400 MG tablet Take 400 mg by mouth at bedtime.   meloxicam  (MOBIC ) 15 MG tablet Take 15 mg by mouth daily.   NONFORMULARY OR COMPOUNDED ITEM Curcumin 1,000 mg po once a day   NONFORMULARY OR COMPOUNDED ITEM Tart cherry juice concentrate capsules 1 capsule po BID   ondansetron  (ZOFRAN -ODT) 4 MG disintegrating tablet Take 1 tablet (4 mg total) by mouth every 8 (eight) hours as needed for nausea or vomiting.   polyethylene glycol (MIRALAX / GLYCOLAX) 17 g packet Take 17 g by mouth daily.   Riboflavin 400 MG CAPS Take 400 mg by mouth every morning.   senna (SENOKOT) 8.6 MG tablet Take 1 tablet by mouth daily.   tiZANidine (ZANAFLEX) 4 MG capsule Take 4 mg by mouth every 6 (six) hours as needed for muscle spasms.   No facility-administered encounter medications on file as of 03/29/2024.

## 2024-03-29 ENCOUNTER — Ambulatory Visit (INDEPENDENT_AMBULATORY_CARE_PROVIDER_SITE_OTHER): Payer: Worker's Compensation | Admitting: Family Medicine

## 2024-03-29 ENCOUNTER — Encounter: Payer: Self-pay | Admitting: Family Medicine

## 2024-03-29 VITALS — BP 120/88 | HR 52 | Temp 97.3°F | Ht 75.0 in | Wt 212.4 lb

## 2024-03-29 DIAGNOSIS — M1711 Unilateral primary osteoarthritis, right knee: Secondary | ICD-10-CM

## 2024-03-29 MED ORDER — TRIAMCINOLONE ACETONIDE 40 MG/ML IJ SUSP
40.0000 mg | Freq: Once | INTRAMUSCULAR | Status: AC
  Administered 2024-03-29: 40 mg via INTRA_ARTICULAR

## 2024-04-20 ENCOUNTER — Telehealth: Payer: Self-pay | Admitting: Primary Care

## 2024-04-20 NOTE — Telephone Encounter (Signed)
 Hello, Francis Herman. You would have to contact the [facility] department in order to get your situation fixed. The phone number is [phone]

## 2024-04-24 NOTE — Progress Notes (Unsigned)
     Alfredo Collymore T. Octa Uplinger, MD, CAQ Sports Medicine [facility] at [facility] [address] [place], [address]  Phone: [phone]  FAX: [phone]  Francis Herman - 43 y.o. male  MRN [id number]  Date of Birth: 14-Nov-1980  Date: 04/26/2024  PCP: Gretta Comer POUR, NP  Referral: Gretta Comer POUR, NP  No chief complaint on file.  Subjective:   Francis Herman is a 43 y.o. very pleasant male patient with There is no height or weight on file to calculate BMI. who presents with the following:  Discussed the use of AI scribe software for clinical note transcription with the patient, who gave verbal consent to proceed.  Patient is here for an acute work and for ongoing injury or accident. History of Present Illness     Review of Systems is noted in the HPI, as appropriate  Objective:   There were no vitals taken for this visit.  GEN: No acute distress; alert,appropriate. PULM: Breathing comfortably in no respiratory distress PSYCH: Normally interactive.   Physical Exam   Laboratory and Imaging Data:  Assessment and Plan:   No diagnosis found. Assessment & Plan   Medication Management during today's office visit: No orders of the defined types were placed in this encounter.  There are no discontinued medications.  Orders placed today for conditions managed today: No orders of the defined types were placed in this encounter.   Disposition: No follow-ups on file.  Dragon [employer] One speech-to-text software was used for transcription in this dictation.  Possible transcriptional errors can occur using Animal nutritionist.   Signed,  Jacques DASEN. Alizandra Loh, MD   Outpatient Encounter Medications as of 04/26/2024  Medication Sig   amitriptyline (ELAVIL) 100 MG tablet Take 100 mg by mouth at bedtime.   co-enzyme Q-10 30 MG capsule Take 30 mg by mouth daily.   diclofenac  Sodium (VOLTAREN ) 1 % GEL Apply 2 g topically 3 (three) times daily as needed  (pain).   magnesium oxide (MAG-OX) 400 MG tablet Take 400 mg by mouth at bedtime.   meloxicam  (MOBIC ) 15 MG tablet Take 15 mg by mouth daily.   Misc Natural Products (TART CHERRY ADVANCED) CAPS Take 1 capsule by mouth 2 (two) times daily.   ondansetron  (ZOFRAN -ODT) 4 MG disintegrating tablet Take 1 tablet (4 mg total) by mouth every 8 (eight) hours as needed for nausea or vomiting.   polyethylene glycol (MIRALAX / GLYCOLAX) 17 g packet Take 17 g by mouth daily.   Riboflavin 400 MG CAPS Take 400 mg by mouth every morning.   senna (SENOKOT) 8.6 MG tablet Take 1 tablet by mouth daily.   tiZANidine (ZANAFLEX) 4 MG capsule Take 4 mg by mouth every 6 (six) hours as needed for muscle spasms.   Turmeric Curcumin 500 MG CAPS Take 2 capsules by mouth daily.   No facility-administered encounter medications on file as of 04/26/2024.

## 2024-04-26 ENCOUNTER — Ambulatory Visit (INDEPENDENT_AMBULATORY_CARE_PROVIDER_SITE_OTHER)
Admission: RE | Admit: 2024-04-26 | Discharge: 2024-04-26 | Disposition: A | Discharge status: Home / Self Care | Source: Ambulatory Visit | Attending: Family Medicine | Admitting: Family Medicine

## 2024-04-26 ENCOUNTER — Ambulatory Visit (INDEPENDENT_AMBULATORY_CARE_PROVIDER_SITE_OTHER): Payer: Worker's Compensation | Admitting: Family Medicine

## 2024-04-26 VITALS — BP 120/84 | HR 75 | Temp 98.9°F | Ht 75.0 in | Wt 205.1 lb

## 2024-04-26 DIAGNOSIS — S83412A Sprain of medial collateral ligament of left knee, initial encounter: Secondary | ICD-10-CM

## 2024-04-26 DIAGNOSIS — M1711 Unilateral primary osteoarthritis, right knee: Secondary | ICD-10-CM | POA: Diagnosis not present

## 2024-04-26 DIAGNOSIS — M25361 Other instability, right knee: Secondary | ICD-10-CM | POA: Diagnosis not present

## 2024-04-26 DIAGNOSIS — M25561 Pain in right knee: Secondary | ICD-10-CM

## 2024-04-27 ENCOUNTER — Encounter: Payer: Self-pay | Admitting: Family Medicine

## 2024-05-07 ENCOUNTER — Encounter: Payer: Self-pay | Admitting: *Deleted

## 2024-05-07 ENCOUNTER — Telehealth: Payer: Self-pay

## 2024-05-07 NOTE — Telephone Encounter (Signed)
 Spoke with Mr. Glasscock.  He is already scheduled with the [facility].  He states he was mainly calling about the MRI.  I have sent the STAT pool a message asking for someone to check into this as well as routed the phone note the [facility].

## 2024-05-07 NOTE — Telephone Encounter (Signed)
 Copied from CRM [id number]. Topic: Clinical - Lab/Test Results >> May 07, 2024  9:53 AM Francis Herman wrote: Reason for CRM: Patient requests a CD of his xray of chest, transferring to medical records

## 2024-05-07 NOTE — Telephone Encounter (Signed)
 I already gave him an order.  I gave him a very detailed handwritten [facility] order.  He should have this -I suspect he just needs direction.

## 2024-05-07 NOTE — Telephone Encounter (Signed)
 Please assist him scheduling his knee MRI.    Francis Herman, gave him a detailed prescription to go to the [facility].  Can you please talk to him and give him some direction to get the [facility] either in [place] or [place].

## 2024-05-07 NOTE — Telephone Encounter (Signed)
 We need to fax an order to [facility].  Please provide.

## 2024-05-07 NOTE — Telephone Encounter (Signed)
 Copied from CRM [id number]. Topic: Clinical - Request for Lab/Test Order >> May 07, 2024  9:22 AM Mia F wrote: Reason for CRM: Pt is asking if he has to wait for the MRI to be approved before he can schedule the MRI. He also mentions that he is supposed to be getting a knee brace fitted and does not know where to go for that. Please advise

## 2024-05-08 NOTE — Telephone Encounter (Signed)
 Called patient it is the xray of knee done by Dr. Watt on 04/26/24. He would like call when ready for pick up.

## 2024-05-10 ENCOUNTER — Ambulatory Visit: Payer: Self-pay

## 2024-05-10 NOTE — Telephone Encounter (Signed)
 FYI Only or Action Required?: FYI only for provider.  Patient was last seen in primary care on 04/26/2024 by Watt Mirza, MD.  Called Nurse Triage reporting Ear Problem.  Symptoms began several days ago.  Interventions attempted: OTC medications: antibiotic ointment.  Symptoms are: stable.  Triage Disposition: See Physician Within 24 Hours (overriding See HCP Within 4 Hours (Or PCP Triage))  Patient/caregiver understands and will follow disposition?: Yes                             Copied from CRM [id number]. Topic: Clinical - Red Word Triage >> May 10, 2024  4:37 PM Harlene ORN wrote: Red Word that prompted transfer to Nurse Triage: earring piercings is lodged in his right ear Reason for Disposition  Part of jewelry (clasp) is stuck inside the earlobe  Answer Assessment - Initial Assessment Questions 1. SYMPTOMS: What symptoms are you concerned about?     Back of ear piercing is stuck in ear obe 2. ONSET:  When did the symptoms start?     2-3 days ago  3. OTHER SYMPTOMS: Do you have any other symptoms? (e.g., drainage, fever, local pain, itching)     Mild brown pus-like drainage, denies pain, denies redness, denies swelling, denies fever 4. PIERCING LOCATION: Where is the piercing? (e.g., ear lobe, upper ear)      Right ear lobe 5. PIERCING DATE: When did you get your piercing?     May of this year 6. JEWELRY MATERIAL: What is your piercing jewelry made out of? (e.g., nickel, 18 K gold, surgical steel)     Silver  Protocols used: Ear Piercing Symptoms and Questions-A-AH

## 2024-05-10 NOTE — Telephone Encounter (Signed)
 Appointment with Tabitha Dugal 05/11/24.

## 2024-05-11 ENCOUNTER — Ambulatory Visit (INDEPENDENT_AMBULATORY_CARE_PROVIDER_SITE_OTHER): Admitting: Family

## 2024-05-11 VITALS — BP 124/74 | HR 81 | Temp 98.6°F | Ht 76.0 in | Wt 209.6 lb

## 2024-05-11 DIAGNOSIS — Z23 Encounter for immunization: Secondary | ICD-10-CM | POA: Diagnosis not present

## 2024-05-11 DIAGNOSIS — L03818 Cellulitis of other sites: Secondary | ICD-10-CM

## 2024-05-11 DIAGNOSIS — L0390 Cellulitis, unspecified: Secondary | ICD-10-CM | POA: Insufficient documentation

## 2024-05-11 DIAGNOSIS — S00451A Superficial foreign body of right ear, initial encounter: Secondary | ICD-10-CM

## 2024-05-11 MED ORDER — SULFAMETHOXAZOLE-TRIMETHOPRIM 800-160 MG PO TABS: 1.0000 | ORAL_TABLET | Freq: Two times a day (BID) | ORAL | 0 refills | Status: AC

## 2024-05-11 NOTE — Telephone Encounter (Signed)
 Spoke with Ibraheem and provided [facility] and provided [facility] phone number to call and schedule his MRI.

## 2024-05-11 NOTE — Telephone Encounter (Signed)
 I have reached out to the patient Via telephone, unable to leave a VM - Line rang several time with no answer.   I have sent two MyChart messages with contact information for scheduling MRI with [facility].   Patient can call [facility] [phone] to schedule.

## 2024-05-11 NOTE — Progress Notes (Signed)
 Established Patient Office Visit  Subjective:      CC:  Chief Complaint  Patient presents with   Acute Visit    Foreign body in his right earlobe    HPI: Rc Amison is a 43 y.o. male presenting on 05/11/2024 for Acute Visit (Foreign body in his right earlobe) .  Discussed the use of AI scribe software for clinical note transcription with the patient, who gave verbal consent to proceed.  History of Present Illness Daesean Lazarz is a 43 year old male who presents with an embedded earring back in his right ear.  Two days ago, he attempted to remove an earring from his right ear and realized the back of the earring was missing. He has had the earring in since March or April of this year. His girlfriend confirmed that the earring back is embedded in the ear, as she could feel it clicking inside.  He denies any pain or aggravation from the embedded earring back. However, there is a presence of yellow pus. He does not recall how long the earring back has been embedded, but it may have been for an extended period as the skin has grown over it.  No fever or discomfort. He has not attempted any prior treatments for this issue.         Social history:  Relevant past medical, surgical, family and social history reviewed and updated as indicated. Interim medical history since our last visit reviewed.  Allergies and medications reviewed and updated.  DATA REVIEWED: CHART IN EPIC     ROS: Negative unless specifically indicated above in HPI.    Current Outpatient Medications:    amitriptyline (ELAVIL) 100 MG tablet, Take 100 mg by mouth at bedtime., Disp: , Rfl:    co-enzyme Q-10 30 MG capsule, Take 30 mg by mouth daily., Disp: , Rfl:    diclofenac  Sodium (VOLTAREN ) 1 % GEL, Apply 2 g topically 3 (three) times daily as needed (pain)., Disp: 100 g, Rfl: 0   magnesium oxide (MAG-OX) 400 MG tablet, Take 400 mg by mouth at bedtime., Disp: , Rfl:    meloxicam  (MOBIC ) 15 MG  tablet, Take 15 mg by mouth daily., Disp: , Rfl:    Misc Natural Products (TART CHERRY ADVANCED) CAPS, Take 1 capsule by mouth 2 (two) times daily., Disp: , Rfl:    ondansetron  (ZOFRAN -ODT) 4 MG disintegrating tablet, Take 1 tablet (4 mg total) by mouth every 8 (eight) hours as needed for nausea or vomiting., Disp: 20 tablet, Rfl: 2   polyethylene glycol (MIRALAX / GLYCOLAX) 17 g packet, Take 17 g by mouth daily., Disp: , Rfl:    Riboflavin 400 MG CAPS, Take 400 mg by mouth every morning., Disp: , Rfl:    senna (SENOKOT) 8.6 MG tablet, Take 1 tablet by mouth daily., Disp: , Rfl:    sulfamethoxazole-trimethoprim (BACTRIM DS) 800-160 MG tablet, Take 1 tablet by mouth 2 (two) times daily for 7 days., Disp: 14 tablet, Rfl: 0   tiZANidine (ZANAFLEX) 4 MG capsule, Take 4 mg by mouth every 6 (six) hours as needed for muscle spasms., Disp: , Rfl:    Turmeric Curcumin 500 MG CAPS, Take 2 capsules by mouth daily., Disp: , Rfl:         Objective:        BP 124/74 (BP Location: Left Arm, Patient Position: Sitting, Cuff Size: Normal)   Pulse 81   Temp 98.6 F (37 C) (Temporal)   Ht 6' 4 (1.93 m)  Wt 209 lb 9.6 oz (95.1 kg)   SpO2 98%   BMI 25.51 kg/m   Physical Exam HEENT: Right ear with pus accumulation and skin overgrowth over earring back. Nontender able to palpate foreign body  Wt Readings from Last 3 Encounters:  05/11/24 209 lb 9.6 oz (95.1 kg)  04/26/24 205 lb 2 oz (93 kg)  03/29/24 212 lb 6 oz (96.3 kg)           Results   Assessment & Plan:   Assessment and Plan Assessment & Plan Retained right earring back with secondary cutaneous abscess of right external ear Retained earring back in the right external ear has resulted in a secondary cutaneous abscess. The earring back is deeply embedded with skin overgrowth, making it inaccessible for removal in a primary care setting. Significant yellow pus indicates infection. The condition likely developed over an extended  period due to lack of earring movement, leading to skin overgrowth and potential keloid formation. Intervention by a plastic surgeon is necessary due to the sensitive location and the need for potential manipulation and suturing of the ear. - Prescribe antibiotics to address the infection and pus formation. - Apply warm compresses to the right ear to help loosen the embedded earring back. - Refer to plastic surgery for removal of the retained earring back and management of the abscess. - Submit referral as urgent to expedite the process, though immediate same-day intervention may not be possible due to it being a Friday.        Return for f/u PCP if no improvement in symptoms.     Ginger Patrick, MSN, APRN, FNP-C [facility]

## 2024-05-14 ENCOUNTER — Emergency Department
Admission: EM | Admit: 2024-05-14 | Discharge: 2024-05-14 | Discharge status: Absent without leave | Source: Home / Self Care

## 2024-05-14 ENCOUNTER — Other Ambulatory Visit: Payer: Self-pay | Admitting: Family Medicine

## 2024-05-14 ENCOUNTER — Ambulatory Visit
Admission: RE | Admit: 2024-05-14 | Discharge: 2024-05-14 | Disposition: A | Discharge status: Home / Self Care | Payer: Self-pay | Source: Ambulatory Visit | Attending: Family Medicine | Admitting: Family Medicine

## 2024-05-14 DIAGNOSIS — M1711 Unilateral primary osteoarthritis, right knee: Secondary | ICD-10-CM | POA: Diagnosis present

## 2024-05-14 DIAGNOSIS — M25361 Other instability, right knee: Secondary | ICD-10-CM | POA: Diagnosis present

## 2024-05-14 DIAGNOSIS — S83412A Sprain of medial collateral ligament of left knee, initial encounter: Secondary | ICD-10-CM | POA: Diagnosis present

## 2024-05-14 DIAGNOSIS — M25561 Pain in right knee: Secondary | ICD-10-CM | POA: Diagnosis present

## 2024-05-14 NOTE — Telephone Encounter (Signed)
 Saw patient already, thank you for speaking with the patient.  Please see note for further information.

## 2024-05-15 ENCOUNTER — Ambulatory Visit: Payer: Self-pay | Admitting: Family Medicine

## 2024-05-15 ENCOUNTER — Telehealth: Payer: Self-pay | Admitting: Primary Care

## 2024-05-15 ENCOUNTER — Encounter: Payer: Self-pay | Admitting: Family

## 2024-05-15 NOTE — Addendum Note (Signed)
 Addended by: ALBINO SHAVER C on: 05/15/2024 08:16 AM   Modules accepted: Orders

## 2024-05-15 NOTE — Telephone Encounter (Signed)
 Copied from CRM [id number]. Topic: Referral - Question >> May 15, 2024  8:38 AM Donna BRAVO wrote: Reason for CRM:  Ludie (Other) [phone] [facility] Worker comp adjuster ext [phone]  Requesting medical records for appt on 04/26/24 Dr Jacques Copland to process prescription for MRI medical notes regarding right knee    Please fax medical records to Fax [phone]   Provided medical records phone number

## 2024-05-15 NOTE — Telephone Encounter (Signed)
 Office note from 04/26/24 faxed to [facility] as requested

## 2024-05-18 ENCOUNTER — Institutional Professional Consult (permissible substitution): Payer: Self-pay

## 2024-05-18 ENCOUNTER — Ambulatory Visit: Admitting: Primary Care

## 2024-05-20 NOTE — Telephone Encounter (Signed)
 [name], I wanted to see if you would see Francis Herman for PRP injections for his knees.  You saw him once earlier in the year.  He has severe arthritis at a young age and had a recent injury.  I am pretty sure that all of you guys do PRP, but I don't.  Thank-you.

## 2024-05-21 ENCOUNTER — Ambulatory Visit (INDEPENDENT_AMBULATORY_CARE_PROVIDER_SITE_OTHER): Payer: Self-pay | Admitting: Family Medicine

## 2024-05-21 ENCOUNTER — Other Ambulatory Visit: Payer: Self-pay

## 2024-05-21 VITALS — BP 136/90 | HR 81 | Ht 76.0 in

## 2024-05-21 DIAGNOSIS — M170 Bilateral primary osteoarthritis of knee: Secondary | ICD-10-CM

## 2024-05-21 DIAGNOSIS — G8929 Other chronic pain: Secondary | ICD-10-CM

## 2024-05-21 DIAGNOSIS — M25561 Pain in right knee: Secondary | ICD-10-CM

## 2024-05-21 DIAGNOSIS — M25562 Pain in left knee: Secondary | ICD-10-CM

## 2024-05-21 NOTE — Patient Instructions (Addendum)
 Thank you for coming in today.  CPT code: 78T, Cost: $500 per injection the day of appointment (cash or credit card) Please let us  know if you would like to proceed with one knee or bilateral knees.

## 2024-05-21 NOTE — Progress Notes (Signed)
   I, [name] am a scribe for Dr. Artist Lloyd, MD.  Francis Herman is a 43 y.o. male who presents to [facility] at [facility] today for bilat knee pain. Pt was last seen by Dr. Lloyd on 09/19/23 for bilat knee pain. Benefits were checked for Orthovisc injection but no visit was scheduled to receive Orthovisc injections.   Today, patient reports that he would like to talk about the PRP process. Patient is on crushes today.    Pain is significant in both knees severe on the right.  He is currently nonweightbearing for right knee pain. Dx imaging: 08/29/23 R knee XR                     01/05/17 R & L knee XR                     03/05/13 R knee MRI   Pertinent review of systems: No fevers or chills  Relevant historical information: Cervical radiculopathy and occipital headache.   Exam:  BP (!) 136/90   Pulse 81   Ht 6' 4 (1.93 m)   SpO2 98%   BMI 25.51 kg/m  General: Well Developed, well nourished, and in no acute distress.   MSK: Bilateral knees moderate effusion decreased range of motion.  Using crutches to ambulate without weightbearing right knee.    Lab and Radiology Results       Assessment and Plan: 43 y.o. male with chronic bilateral knee pain due to DJD.  Patient recently had an MRI right knee that showed more significant DJD.  He is interested in pursuing PRP injections at least for the right and probably for both knees.  He is try to get this approved through [employer].  I think this would be reasonable and a reasonable next step.  We provided him with the CPT code and cost of PRP injection and he will let us  know if it will be approved through [employer].  Okay to schedule back once we have a firm idea of coverage.  PDMP not reviewed this encounter. No orders of the defined types were placed in this encounter.  No orders of the defined types were placed in this encounter.    Discussed warning signs or  symptoms. Please see discharge instructions. Patient expresses understanding.   The above documentation has been reviewed and is accurate and complete Artist Lloyd, M.D. No charge today anticipate PRP injection in the near future.

## 2024-05-22 ENCOUNTER — Ambulatory Visit

## 2024-05-27 NOTE — Progress Notes (Addendum)
 "    Jamileth Putzier T. Ercell Perlman, MD, CAQ Sports Medicine [facility] at [facility] [address] [place], [address]  Phone: [phone]  FAX: [phone]  Giovannie Scerbo - 43 y.o. male  MRN [id number]  Date of Birth: [birth year]  Date: 05/28/2024  PCP: Gretta Comer POUR, NP  Referral: Gretta Comer POUR, NP  Chief Complaint  Patient presents with   Knee Pain    RIght-Follow up   Subjective:   Francis Herman is a 43 y.o. very pleasant male patient with Body mass index is 25.97 kg/m. who presents with the following:  Discussed the use of AI scribe software for clinical note transcription with the patient, who gave verbal consent to proceed.  I saw Mattheu roughly 1 month ago with some ongoing knee pain.  He has known severe arthritis.  With concern for additional derangement, we did get an MRI of the right knee at that time, which shows severe arthritis.  He contacted me about possible PRP injection, so I referred him to Dr. Joane.  They are waiting on approval from [employer].  Ludie Saba, w/c case manager  History of Present Illness Daiquan Resnik is a 43 year old male with end stage right knee arthritis and severe left knee osteoarthritis who presents with questions about treatment options.  He has a history of end stage arthritis in the right knee and severe osteoarthritis in the left knee, leading to significant impairment. He reports a long history of end stage arthritis in the right knee and severe osteoarthritis in the left knee, which have affected his ability to walk and perform daily activities. He uses crutches for mobility right now and is concerned about his quality of life.  MRI of the right knee was obtained on May 14, 2024, due to worsening symptoms to evaluate for occult fracture or other internal derangement.  Essentially, the findings were some severe knee arthritis.  He had an injury at work on June 05, 2023, since then he has had  exacerbated pain in his knee, particular the right knee.  He has previously undergone various treatments, including therapy and corticosteroid injections, which provided relief for three to six months.   We have discussed viscosupplementation in the past, but he has never done this in the past.  We also have discussed PRP injections, and he recently saw Dr. Joane who was going to work with him about possible PRP injection.  He had some questions regarding high tibial osteotomy.  He has a sister who is a physician who also recommended whole leg alignment x-rays.    He has previously consulted regarding PRP treatment, which was not covered by insurance, and has considered Viscosupplementation, which is more likely covered. He is seeking further guidance on these options and is open to consulting with a total joint surgeon for potential surgical interventions.  He has some particular questions from his attorney that will be scanned into the chart.  Review of Systems is noted in the HPI, as appropriate  Objective:   BP 128/80   Pulse 69   Temp 98.6 F (37 C) (Temporal)   Ht 6' 4 (1.93 m)   Wt 213 lb 6 oz (96.8 kg)   SpO2 100%   BMI 25.97 kg/m   GEN: No acute distress; alert,appropriate. PULM: Breathing comfortably in no respiratory distress PSYCH: Normally interactive.   Medial and lateral joint line tenderness Currently using crutches Pain with weightbearing  Laboratory and Imaging Data:  MR Knee Right  Wo Contrast Result Date: 05/14/2024 MR KNEE WITHOUT IV CONTRAST RIGHT COMPARISON: 03/05/2013 CLINICAL HISTORY: Evaluate for occult fracture, tibial plateau. PULSE SEQUENCES: Ax PD FS, Sag T2 ACL, Sag PD FS, Cor PD FS & COR T1 FINDINGS: Bones: There is moderate to severe tricompartmental osteoarthrosis most notably the medial compartment. There is full-thickness cartilage loss with subchondral reactive edema and cystic change in the medial tibial plateau and to a lesser extent the  medial femoral condyle. Small reactive joint effusion is present. The extensor mechanism is intact. Ligaments: The ACL is increased in signal with intact fibers. The PCL, MCL and fibular collateral ligaments are intact. Menisci: There is a diminutive body and posterior horn of the lateral meniscus without evidence of discrete tear. Likely post meniscectomy changes in the medial compartment with fraying and irregularity of the residual posterior horn. Clinical correlation. There is only a rim of medial meniscal tissue remaining. IMPRESSION: Moderate to severe tricompartment osteoarthrosis most notably the medial compartment with full-thickness cartilage loss, subchondral reactive edema and cystic change. No evidence of a fracture as questioned. Small reactive joint effusion. Likely post meniscectomy changes in the medial compartment with fraying and irregularity of the residual posterior horn. Slightly diminutive lateral meniscus without tear. Increased signal in the ACL likely reflective of mucoid degeneration. There are intact fibers. Electronically signed by: Norleen Satchel MD 05/14/2024 09:17 AM EDT RP Workstation: [id number]     Assessment and Plan:     ICD-10-CM   1. Primary osteoarthritis of right knee  M17.11 Ambulatory referral to Orthopedic Surgery    2. Primary osteoarthritis of knees, bilateral  M17.0      Plain films of the right knee were reviewed again today.  He does have end-stage osteoarthritis of the right knee. -MRI as above with severe knee arthritis.  No other acute internal derangement.  Total encounter time: 35 minutes. This includes total time spent on the day of encounter.  Extensive discussion regarding ongoing bone and joint problems. Assessment & Plan End stage right knee osteoarthritis with exacerbation and severe left knee osteoarthritis End stage osteoarthritis in the right knee with exacerbation and severe osteoarthritis in the left knee, aggravated by recent injuries,  resulting in marked impairment and inability to function without assistance. - Discussed referral to a total joint surgeon for possible total knee arthroplasty.  He had some specific questions also about high tibial osteotomy.  I think these can best be addressed by a total joint orthopedic surgeon.  He also had some questions about whole leg knee alignment x-rays.  This is not part of my typical medical practice, and our x-ray techs are also unfamiliar, so I think this is best addressed by the total joint surgeon. - Consider viscosupplementation.   - Consider PRP, he saw Dr. Joane last week.  He additionally has significant left-sided knee osteoarthritis, exacerbated since his ongoing problems with his right knee.  Patient gave me a series of questions, posed by his attorney on paper, and I did write out by hand my answers to these questions, but I will detail them below.  Question whether left-sided knee pain, condition and arthritis are a consequence of the aggravation of his right knee with his fall at work on June 05, 2023. -Patient does have baseline bad, longstanding L knee osteoarthritis, but his accident and injury to the right knee could have led to exacerbation of his left-sided knee osteoarthritis exacerbation and pain.  In your opinion is surgery for the patient's right knee now necessary at  least in part due to his 06/05/2023 work fall including aggravation exacerbation acceleration of pre-existing condition if impacted. - Yes, but at baseline the patient has severe osteoarthritis of the knee, but clinically worsened with more pain after accident  If not recommended currently is not your opinion that Mr. Pasternak will need surgery to his right knee in the future and that the surgery will be necessary sooner due at least in part from his June 05, 2023 work accident.  I do not think that you can make that assumption.  It is possible, but the patient has severe osteoarthritis at  baseline.  Is your opinion that above recommendation for Mr. Keep his right and left knees are now necessary at least in part to his 06/05/2023 work fall including any aggravation, exacerbation, or acceleration of pre-existing condition it impacted.  Yes, I think that you can make that assumption with a reasonable degree of medical certainty.  Orders placed today for conditions managed today: Orders Placed This Encounter  Procedures   Ambulatory referral to Orthopedic Surgery    Disposition: No follow-ups on file.  [employer] One speech-to-text software was used for transcription in this dictation.  Possible transcriptional errors can occur using Animal nutritionist.   Signed,  Jacques DASEN. Aubrielle Stroud, MD   Outpatient Encounter Medications as of 05/28/2024  Medication Sig   amitriptyline (ELAVIL) 100 MG tablet Take 100 mg by mouth at bedtime.   co-enzyme Q-10 30 MG capsule Take 30 mg by mouth daily.   diclofenac  Sodium (VOLTAREN ) 1 % GEL Apply 2 g topically 3 (three) times daily as needed (pain).   magnesium oxide (MAG-OX) 400 MG tablet Take 400 mg by mouth at bedtime.   meloxicam  (MOBIC ) 15 MG tablet Take 15 mg by mouth daily.   Misc Natural Products (TART CHERRY ADVANCED) CAPS Take 1 capsule by mouth 2 (two) times daily.   mupirocin ointment (BACTROBAN) 2 % Apply 1 Application topically 4 (four) times daily.   ondansetron  (ZOFRAN -ODT) 4 MG disintegrating tablet Take 1 tablet (4 mg total) by mouth every 8 (eight) hours as needed for nausea or vomiting.   OVER THE COUNTER MEDICATION daily. Prime labs test pro, Testosterone  booster   OVER THE COUNTER MEDICATION in the morning and at bedtime. Snap Nitric Oxide Booster   polyethylene glycol (MIRALAX / GLYCOLAX) 17 g packet Take 17 g by mouth daily.   Riboflavin 400 MG CAPS Take 400 mg by mouth every morning.   senna (SENOKOT) 8.6 MG tablet Take 1 tablet by mouth daily.   tiZANidine (ZANAFLEX) 4 MG capsule Take 4 mg by mouth  every 6 (six) hours as needed for muscle spasms.   Turmeric Curcumin 500 MG CAPS TAKE 2 CAPSULES ONCE A DAY   No facility-administered encounter medications on file as of 05/28/2024.   "

## 2024-05-28 ENCOUNTER — Ambulatory Visit (INDEPENDENT_AMBULATORY_CARE_PROVIDER_SITE_OTHER): Payer: Worker's Compensation | Admitting: Family Medicine

## 2024-05-28 ENCOUNTER — Encounter: Payer: Self-pay | Admitting: Family Medicine

## 2024-05-28 VITALS — BP 128/80 | HR 69 | Temp 98.6°F | Ht 76.0 in | Wt 213.4 lb

## 2024-05-28 DIAGNOSIS — M1711 Unilateral primary osteoarthritis, right knee: Secondary | ICD-10-CM | POA: Diagnosis not present

## 2024-05-28 DIAGNOSIS — M170 Bilateral primary osteoarthritis of knee: Secondary | ICD-10-CM | POA: Diagnosis not present

## 2024-05-29 ENCOUNTER — Encounter: Payer: Self-pay | Admitting: Family Medicine

## 2024-06-01 ENCOUNTER — Other Ambulatory Visit: Payer: Self-pay | Admitting: Family Medicine

## 2024-06-12 ENCOUNTER — Telehealth: Payer: Self-pay | Admitting: Primary Care

## 2024-06-12 NOTE — Telephone Encounter (Signed)
 Copied from CRM [id number]. 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

## 2024-06-19 ENCOUNTER — Telehealth: Payer: Self-pay | Admitting: Family Medicine

## 2024-06-19 NOTE — Telephone Encounter (Signed)
 Benefit request submitted for Monovisc gel injection of the right knee.  Will reach out to provider when coverage infomation is provided.

## 2024-06-19 NOTE — Telephone Encounter (Signed)
 Left message for Francis Herman that we aren't able to do the Visco injections on Wednesday as schedule.  We need to run his benefits through his insurance first to see what they cover.  I ask that he call the office to cancel his appointment on Wednesday with Dr. Watt unless he is needing to be seen for something other than the Visco injections.   Erin,  please run benefits on Francis Herman for Visco injections.

## 2024-06-19 NOTE — Telephone Encounter (Signed)
 See phone encounter I started today 06/19/24.

## 2024-06-19 NOTE — Telephone Encounter (Signed)
 He has an appointment with me tomorrow.  I am not sure I have much to add since I saw him a few weeks ago.  I want to make sure that he has the appointment with the Ortho total joint surgeon?  The appt note says something about Visco, but we would need to get that approved prior to using that medication.

## 2024-06-19 NOTE — Progress Notes (Deleted)
 Francis Herman T. Francis Golembeski, MD, CAQ Sports Medicine [facility] at [facility] [address] [place], [address]  Phone: [phone]  FAX: [phone]  Francis Herman - 43 y.o. male  MRN [id number]  Date of Birth: 1980-10-26  Date: 06/20/2024  PCP: Gretta Comer POUR, NP  Referral: Gretta Comer POUR, NP  No chief complaint on file.  Subjective:   [name] is a 43 y.o. very pleasant male patient with There is no height or weight on file to calculate BMI. who presents with the following:  Discussed the use of AI scribe software for clinical note transcription with the patient, who gave verbal consent to proceed.  I have included details from his last office visit.  Patient has chronic knee pain.  Recently referred him to a total joint surgeon.  End-stage knee arthritis.  I previously referred him to Dr. Joane, who offered PRP injection. History of Present Illness   06/19/2024 Last OV with Francis Schroeder, MD   I saw Francis Herman roughly 1 month ago with some ongoing knee pain.  He has known severe arthritis.  With concern for additional derangement, we did get an MRI of the right knee at that time, which shows severe arthritis.   He contacted me about possible PRP injection, so I referred him to Dr. Joane.  They are waiting on approval from [employer].   Ludie Saba, w/c case manager   History of Present Illness Francis Herman is a 43 year old male with end stage right knee arthritis and severe left knee osteoarthritis who presents with questions about treatment options.   He has a history of end stage arthritis in the right knee and severe osteoarthritis in the left knee, leading to significant impairment. He reports a long history of end stage arthritis in the right knee and severe osteoarthritis in the left knee, which have affected his ability to walk and perform daily activities. He uses crutches for mobility right now and is concerned about his quality  of life.   MRI of the right knee was obtained on May 14, 2024, due to worsening symptoms to evaluate for occult fracture or other internal derangement.  Essentially, the findings were some severe knee arthritis.   He had an injury at work on June 05, 2023, since then he has had exacerbated pain in his knee, particular the right knee.   He has previously undergone various treatments, including therapy and corticosteroid injections, which provided relief for three to six months.    We have discussed viscosupplementation in the past, but he has never done this in the past.  We also have discussed PRP injections, and he recently saw Dr. Joane who was going to work with him about possible PRP injection.   He had some questions regarding high tibial osteotomy.  He has a sister who is a physician who also recommended whole leg alignment x-rays.     He has previously consulted regarding PRP treatment, which was not covered by insurance, and has considered Viscosupplementation, which is more likely covered. He is seeking further guidance on these options and is open to consulting with a total joint surgeon for potential surgical interventions.   He has some particular questions from his attorney that will be scanned into the chart.  Review of Systems is noted in the HPI, as appropriate  Objective:   There were no vitals taken for this visit.  GEN: No acute distress; alert,appropriate. PULM: Breathing comfortably in no respiratory  distress PSYCH: Normally interactive.   Laboratory and Imaging Data:  Assessment and Plan:   No diagnosis found. Assessment & Plan   Medication Management during today's office visit: No orders of the defined types were placed in this encounter.  There are no discontinued medications.  Orders placed today for conditions managed today: No orders of the defined types were placed in this encounter.   Disposition: No follow-ups on file.  [employer] speech-to-text software was used for transcription in this dictation.  Possible transcriptional errors can occur using Animal nutritionist.   Signed,  Francis DASEN. Kaycie Pegues, MD   Outpatient Encounter Medications as of 06/20/2024  Medication Sig   amitriptyline (ELAVIL) 100 MG tablet Take 100 mg by mouth at bedtime.   co-enzyme Q-10 30 MG capsule Take 30 mg by mouth daily.   diclofenac  Sodium (VOLTAREN ) 1 % GEL Apply 2 g topically 3 (three) times daily as needed (pain).   magnesium oxide (MAG-OX) 400 MG tablet Take 400 mg by mouth at bedtime.   meloxicam  (MOBIC ) 15 MG tablet Take 15 mg by mouth daily.   Misc Natural Products (TART CHERRY ADVANCED) CAPS TAKE 1 CAPSULE BY MOUTH TWICE A DAY   mupirocin ointment (BACTROBAN) 2 % Apply 1 Application topically 4 (four) times daily.   ondansetron  (ZOFRAN -ODT) 4 MG disintegrating tablet Take 1 tablet (4 mg total) by mouth every 8 (eight) hours as needed for nausea or vomiting.   OVER THE COUNTER MEDICATION daily. Prime labs test pro, Testosterone  booster   OVER THE COUNTER MEDICATION in the morning and at bedtime. Snap Nitric Oxide Booster   polyethylene glycol (MIRALAX / GLYCOLAX) 17 g packet Take 17 g by mouth daily.   Riboflavin 400 MG CAPS Take 400 mg by mouth every morning.   senna (SENOKOT) 8.6 MG tablet Take 1 tablet by mouth daily.   tiZANidine (ZANAFLEX) 4 MG capsule Take 4 mg by mouth every 6 (six) hours as needed for muscle spasms.   Turmeric Curcumin 500 MG CAPS TAKE 2 CAPSULES ONCE A DAY   No facility-administered encounter medications on file as of 06/20/2024.

## 2024-06-20 ENCOUNTER — Ambulatory Visit: Admitting: Family Medicine

## 2024-06-20 ENCOUNTER — Telehealth: Payer: Self-pay | Admitting: Family Medicine

## 2024-06-20 NOTE — Telephone Encounter (Signed)
 This patients referral was faxed to [facility] on 11/7, patient was also notified via Mychart letter with Referral information. We do not schedule appointments on behalf of the patient, The patient was notified of where his referral was sent and where to contact if not heard anything within 5 business days. [facility] also has their own process of reviewing referrals prior to scheduling. If they are behind it could delay their scheduling process and reaching out to the patient, which is why we send the letter to instruct patients to reach out if they do not hear anything.   I can also see within Care Everywhere that the patient is already established within the [facility] with Ortho (Dr Maxcine - seen 05/22/24) and PMR (Dr Cesario - seen 06/01/24), he should not need a referral, he can call [facility] and request an appt.   But again his referral was faxed to [facility] on 11/7

## 2024-06-20 NOTE — Telephone Encounter (Signed)
 Spoke with [employer].  He does want the Visco injection to go through his worker's comp.  I provided names and prices of Monovisc, Synvisc-One and Durolane and ask him to reach out to his worker's comp agent to see if this is something they would cover under his worker's comp claim.  Patient states understanding.

## 2024-06-20 NOTE — Telephone Encounter (Signed)
 We are going to have to speak to him to see if he would like to file the visco injections to his health insurance.  He has been filing many of his visits to worker's comp, and if that is the case, we will need to involve his worker's comp sports coach.

## 2024-06-20 NOTE — Telephone Encounter (Signed)
 Summary of benefits received   Deductible does not apply  Once OOP has been met patient is covered at 100%  Prior authorization is required for gel injections through [employer] (patients health insurance)  Forms have been placed in providers box to complete.  Once complete, forms need to be faxed to [phone]

## 2024-06-20 NOTE — Telephone Encounter (Signed)
 I wanted to follow-up on this patient's Orthopedic Surgery referral.  I cannot see in the chart where he has an appointment.  He needs to see a total joint surgeon.  Please let me know the date of his appointment.  Thank-you.

## 2024-06-21 ENCOUNTER — Telehealth: Payer: Self-pay | Admitting: Family Medicine

## 2024-06-21 NOTE — Telephone Encounter (Signed)
 I have received additional communication from [facility], [place], this patient's attorney out of [place] .  I would like to document this in the patient's medical record.  The original questionnaire will be scanned into the patient's chart.  What is your current diagnosis for Mr. Eble complaints?  Diagnosis: primary osteoarthritis of the right knee. Primary osteoarthritis of the left knee.  Within the last year, the patient has seen and been managed by orthopedic spine surgery, physical medicine and rehab, an additional sports medicine provider, primary care, neurology, pain management physician, and physical therapy.  He has been referred to a total joint orthopedic surgeon.  2.   Do you believe Mr. Pote is capable of returning to full duty or do you recommend work restrictions?  From a knee arthritis and knee pain standpoint, I do not believe that the patient would be able to stand, walk, or lift patients repetitively, which would limit his ability to work at full capacity.  His other treating physicians, based on what they have managed for the patient and their expertise may have different recommendations.  3.  The above restrictions necessary because of Mr. Nabers June 05, 2023 or August 05, 2023 on-the-job accident or for some other reason.  Mr. Searls requires restrictions for severe bilateral knee degenerative joint disease and knee pain.  The arthritis is well-documented in the medical record going back many years.  Injury can cause exacerbation of arthritic conditions and pain, but the patient's knee arthritis is longstanding.  His other subspecialist physicians may have different recommendations based on their expertise.  4.  If Mr. Celona either cannot return to work now or requires restrictions, please indicate when you believe he will be able to return to work without restrictions.  He may be able to return to work after additional knee treatment.  After total joint  arthroplasty of the knee, I believe that he would be able to return to work without restriction from knee pain.

## 2024-07-26 NOTE — Telephone Encounter (Signed)
 Pt called to follow up on this situation. He states that his workers comp will cover the injections but cannot move forward without the order so he is requesting for that to be completed and uploaded to mychart for him to forward to his attorney as well as a plan of action. He states his visit from 10/20 needs to be updated with a plan of action with what Dr Watt discussed at this visit such as the injections and possible total knee replacement. The attorneys sent over a request for more information in November but this needs to be included as well. Thank you
